# Patient Record
Sex: Female | Born: 1975 | Race: White | Hispanic: No | Marital: Married | Smoking: Former smoker
Health system: Southern US, Community
[De-identification: ages and names within clinical notes are randomized; demographics above are authoritative.]

## PROBLEM LIST (undated history)

## (undated) ENCOUNTER — Inpatient Hospital Stay (HOSPITAL_COMMUNITY): Payer: Self-pay

## (undated) DIAGNOSIS — D569 Thalassemia, unspecified: Secondary | ICD-10-CM

## (undated) DIAGNOSIS — I10 Essential (primary) hypertension: Secondary | ICD-10-CM

## (undated) DIAGNOSIS — I429 Cardiomyopathy, unspecified: Secondary | ICD-10-CM

## (undated) HISTORY — DX: Cardiomyopathy, unspecified: I42.9

---

## 2003-12-25 ENCOUNTER — Other Ambulatory Visit: Admission: RE | Admit: 2003-12-25 | Discharge: 2003-12-25 | Payer: Self-pay | Admitting: Family Medicine

## 2005-01-27 ENCOUNTER — Other Ambulatory Visit: Admission: RE | Admit: 2005-01-27 | Discharge: 2005-01-27 | Payer: Self-pay | Admitting: Family Medicine

## 2005-06-01 ENCOUNTER — Other Ambulatory Visit: Admission: RE | Admit: 2005-06-01 | Discharge: 2005-06-01 | Payer: Self-pay | Admitting: Family Medicine

## 2006-02-19 ENCOUNTER — Other Ambulatory Visit: Admission: RE | Admit: 2006-02-19 | Discharge: 2006-02-19 | Payer: Self-pay | Admitting: Family Medicine

## 2007-03-29 ENCOUNTER — Other Ambulatory Visit: Admission: RE | Admit: 2007-03-29 | Discharge: 2007-03-29 | Payer: Self-pay | Admitting: Family Medicine

## 2007-04-04 ENCOUNTER — Ambulatory Visit: Payer: Self-pay | Admitting: Internal Medicine

## 2007-04-11 LAB — CBC & DIFF AND RETIC
BASO%: 0.9 % (ref 0.0–2.0)
EOS%: 0.5 % (ref 0.0–7.0)
HCT: 33.4 % — ABNORMAL LOW (ref 34.8–46.6)
LYMPH%: 18.3 % (ref 14.0–48.0)
MCH: 21.2 pg — ABNORMAL LOW (ref 26.0–34.0)
MCHC: 33.1 g/dL (ref 32.0–36.0)
MCV: 64 fL — ABNORMAL LOW (ref 81.0–101.0)
MONO%: 9.2 % (ref 0.0–13.0)
NEUT%: 71.1 % (ref 39.6–76.8)
Platelets: 350 10*3/uL (ref 145–400)

## 2007-04-13 LAB — IRON AND TIBC
%SAT: 4 % — ABNORMAL LOW (ref 20–55)
Iron: 17 ug/dL — ABNORMAL LOW (ref 42–145)

## 2007-04-13 LAB — HEMOGLOBINOPATHY EVALUATION
Hemoglobin Other: 0 % (ref 0.0–0.0)
Hgb A: 93.8 % — ABNORMAL LOW (ref 96.8–97.8)

## 2007-04-13 LAB — COMPREHENSIVE METABOLIC PANEL
BUN: 6 mg/dL (ref 6–23)
CO2: 22 mEq/L (ref 19–32)
Glucose, Bld: 105 mg/dL — ABNORMAL HIGH (ref 70–99)
Sodium: 139 mEq/L (ref 135–145)
Total Bilirubin: 0.6 mg/dL (ref 0.3–1.2)
Total Protein: 7.2 g/dL (ref 6.0–8.3)

## 2007-04-13 LAB — FOLATE RBC: RBC Folate: 654 ng/mL — ABNORMAL HIGH (ref 180–600)

## 2007-04-13 LAB — PROTEIN ELECTROPHORESIS, SERUM
Alpha-2-Globulin: 11.3 % (ref 7.1–11.8)
Gamma Globulin: 13.8 % (ref 11.1–18.8)

## 2007-04-13 LAB — VITAMIN B12: Vitamin B-12: 232 pg/mL (ref 211–911)

## 2007-04-13 LAB — LACTATE DEHYDROGENASE: LDH: 135 U/L (ref 94–250)

## 2007-04-25 LAB — CBC WITH DIFFERENTIAL/PLATELET
Basophils Absolute: 0.1 10*3/uL (ref 0.0–0.1)
EOS%: 0.9 % (ref 0.0–7.0)
Eosinophils Absolute: 0.1 10*3/uL (ref 0.0–0.5)
HCT: 32.8 % — ABNORMAL LOW (ref 34.8–46.6)
HGB: 10.8 g/dL — ABNORMAL LOW (ref 11.6–15.9)
MCH: 20.8 pg — ABNORMAL LOW (ref 26.0–34.0)
NEUT#: 8.1 10*3/uL — ABNORMAL HIGH (ref 1.5–6.5)
NEUT%: 66 % (ref 39.6–76.8)
lymph#: 3 10*3/uL (ref 0.9–3.3)

## 2007-04-25 LAB — LACTATE DEHYDROGENASE: LDH: 125 U/L (ref 94–250)

## 2007-06-16 ENCOUNTER — Ambulatory Visit: Payer: Self-pay | Admitting: Internal Medicine

## 2007-06-20 LAB — CBC WITH DIFFERENTIAL/PLATELET
BASO%: 0.4 % (ref 0.0–2.0)
EOS%: 3.2 % (ref 0.0–7.0)
HCT: 32.8 % — ABNORMAL LOW (ref 34.8–46.6)
MCH: 20.9 pg — ABNORMAL LOW (ref 26.0–34.0)
MCHC: 33 g/dL (ref 32.0–36.0)
MONO#: 0.7 10*3/uL (ref 0.1–0.9)
NEUT%: 62.5 % (ref 39.6–76.8)
RBC: 5.17 10*6/uL (ref 3.70–5.32)
WBC: 9.1 10*3/uL (ref 3.9–10.0)
lymph#: 2.4 10*3/uL (ref 0.9–3.3)

## 2007-06-20 LAB — IRON AND TIBC
Iron: 74 ug/dL (ref 42–145)
TIBC: 473 ug/dL — ABNORMAL HIGH (ref 250–470)

## 2007-09-15 ENCOUNTER — Ambulatory Visit: Payer: Self-pay | Admitting: Internal Medicine

## 2007-09-19 LAB — CBC WITH DIFFERENTIAL/PLATELET
EOS%: 3.8 % (ref 0.0–7.0)
MCH: 21.1 pg — ABNORMAL LOW (ref 26.0–34.0)
MCV: 63.8 fL — ABNORMAL LOW (ref 81.0–101.0)
MONO%: 9 % (ref 0.0–13.0)
RBC: 5.1 10*6/uL (ref 3.70–5.32)
RDW: 15 % — ABNORMAL HIGH (ref 11.3–14.5)

## 2007-09-19 LAB — IRON AND TIBC
Iron: 124 ug/dL (ref 42–145)
UIBC: 361 ug/dL

## 2007-09-19 LAB — FERRITIN: Ferritin: 75 ng/mL (ref 10–291)

## 2009-03-21 ENCOUNTER — Other Ambulatory Visit: Admission: RE | Admit: 2009-03-21 | Discharge: 2009-03-21 | Payer: Self-pay | Admitting: Family Medicine

## 2009-04-11 ENCOUNTER — Ambulatory Visit: Payer: Self-pay | Admitting: Internal Medicine

## 2009-04-15 LAB — CBC WITH DIFFERENTIAL/PLATELET
Eosinophils Absolute: 0.1 10*3/uL (ref 0.0–0.5)
HGB: 10.7 g/dL — ABNORMAL LOW (ref 11.6–15.9)
MCV: 65.8 fL — ABNORMAL LOW (ref 79.5–101.0)
MONO#: 1 10*3/uL — ABNORMAL HIGH (ref 0.1–0.9)
MONO%: 7 % (ref 0.0–14.0)
NEUT#: 10.5 10*3/uL — ABNORMAL HIGH (ref 1.5–6.5)
RBC: 4.98 10*6/uL (ref 3.70–5.45)
RDW: 15.1 % — ABNORMAL HIGH (ref 11.2–14.5)
WBC: 14.6 10*3/uL — ABNORMAL HIGH (ref 3.9–10.3)
lymph#: 3 10*3/uL (ref 0.9–3.3)

## 2009-04-15 LAB — IRON AND TIBC
Iron: 117 ug/dL (ref 42–145)
TIBC: 465 ug/dL (ref 250–470)
UIBC: 348 ug/dL

## 2009-04-18 LAB — LEUKOCYTE ALKALINE PHOS: Leukocyte Alkaline  Phos Stain: 73 (ref 33–149)

## 2009-04-18 LAB — LACTATE DEHYDROGENASE: LDH: 108 U/L (ref 94–250)

## 2009-06-13 ENCOUNTER — Ambulatory Visit: Payer: Self-pay | Admitting: Internal Medicine

## 2009-06-17 LAB — CBC WITH DIFFERENTIAL/PLATELET
BASO%: 1 % (ref 0.0–2.0)
Eosinophils Absolute: 0.1 10*3/uL (ref 0.0–0.5)
MCHC: 31.8 g/dL (ref 31.5–36.0)
MONO#: 0.8 10*3/uL (ref 0.1–0.9)
NEUT#: 6.5 10*3/uL (ref 1.5–6.5)
RBC: 5.11 10*6/uL (ref 3.70–5.45)
RDW: 14.8 % — ABNORMAL HIGH (ref 11.2–14.5)
WBC: 10.3 10*3/uL (ref 3.9–10.3)
lymph#: 2.8 10*3/uL (ref 0.9–3.3)
nRBC: 0 % (ref 0–0)

## 2009-06-17 LAB — LACTATE DEHYDROGENASE: LDH: 106 U/L (ref 94–250)

## 2010-04-25 ENCOUNTER — Other Ambulatory Visit: Admission: RE | Admit: 2010-04-25 | Discharge: 2010-04-25 | Payer: Self-pay | Admitting: Family Medicine

## 2014-02-06 DIAGNOSIS — I10 Essential (primary) hypertension: Secondary | ICD-10-CM | POA: Diagnosis present

## 2014-02-06 DIAGNOSIS — N979 Female infertility, unspecified: Secondary | ICD-10-CM | POA: Insufficient documentation

## 2015-02-19 LAB — OB RESULTS CONSOLE GC/CHLAMYDIA
Chlamydia: NEGATIVE
GC PROBE AMP, GENITAL: NEGATIVE

## 2015-02-19 LAB — OB RESULTS CONSOLE RUBELLA ANTIBODY, IGM: Rubella: IMMUNE

## 2015-02-19 LAB — OB RESULTS CONSOLE HEPATITIS B SURFACE ANTIGEN: HEP B S AG: NEGATIVE

## 2015-02-19 LAB — OB RESULTS CONSOLE RPR: RPR: NONREACTIVE

## 2015-02-19 LAB — OB RESULTS CONSOLE ABO/RH: RH Type: POSITIVE

## 2015-02-19 LAB — OB RESULTS CONSOLE HIV ANTIBODY (ROUTINE TESTING): HIV: NONREACTIVE

## 2015-02-19 LAB — OB RESULTS CONSOLE ANTIBODY SCREEN: ANTIBODY SCREEN: NEGATIVE

## 2015-03-18 ENCOUNTER — Other Ambulatory Visit: Payer: Self-pay | Admitting: Obstetrics and Gynecology

## 2015-03-19 LAB — CYTOLOGY - PAP

## 2015-08-02 ENCOUNTER — Encounter (HOSPITAL_COMMUNITY): Payer: Self-pay | Admitting: *Deleted

## 2015-08-02 ENCOUNTER — Inpatient Hospital Stay (HOSPITAL_COMMUNITY)
Admission: AD | Admit: 2015-08-02 | Discharge: 2015-08-20 | DRG: 765 | Disposition: A | Discharge status: Home / Self Care | Payer: BLUE CROSS/BLUE SHIELD | Source: Ambulatory Visit | Attending: Obstetrics | Admitting: Obstetrics

## 2015-08-02 ENCOUNTER — Ambulatory Visit (HOSPITAL_COMMUNITY): Payer: BLUE CROSS/BLUE SHIELD

## 2015-08-02 DIAGNOSIS — O4403 Placenta previa specified as without hemorrhage, third trimester: Secondary | ICD-10-CM

## 2015-08-02 DIAGNOSIS — Z3A31 31 weeks gestation of pregnancy: Secondary | ICD-10-CM | POA: Diagnosis not present

## 2015-08-02 DIAGNOSIS — Z87891 Personal history of nicotine dependence: Secondary | ICD-10-CM

## 2015-08-02 DIAGNOSIS — D563 Thalassemia minor: Secondary | ICD-10-CM | POA: Diagnosis present

## 2015-08-02 DIAGNOSIS — O99013 Anemia complicating pregnancy, third trimester: Secondary | ICD-10-CM

## 2015-08-02 DIAGNOSIS — D62 Acute posthemorrhagic anemia: Secondary | ICD-10-CM | POA: Diagnosis not present

## 2015-08-02 DIAGNOSIS — O9902 Anemia complicating childbirth: Secondary | ICD-10-CM | POA: Diagnosis not present

## 2015-08-02 DIAGNOSIS — O42113 Preterm premature rupture of membranes, onset of labor more than 24 hours following rupture, third trimester: Secondary | ICD-10-CM

## 2015-08-02 DIAGNOSIS — O9912 Other diseases of the blood and blood-forming organs and certain disorders involving the immune mechanism complicating childbirth: Secondary | ICD-10-CM | POA: Diagnosis present

## 2015-08-02 DIAGNOSIS — O163 Unspecified maternal hypertension, third trimester: Secondary | ICD-10-CM

## 2015-08-02 DIAGNOSIS — O4290 Premature rupture of membranes, unspecified as to length of time between rupture and onset of labor, unspecified weeks of gestation: Secondary | ICD-10-CM

## 2015-08-02 DIAGNOSIS — D569 Thalassemia, unspecified: Secondary | ICD-10-CM

## 2015-08-02 DIAGNOSIS — O10019 Pre-existing essential hypertension complicating pregnancy, unspecified trimester: Secondary | ICD-10-CM

## 2015-08-02 DIAGNOSIS — O368390 Maternal care for abnormalities of the fetal heart rate or rhythm, unspecified trimester, not applicable or unspecified: Secondary | ICD-10-CM

## 2015-08-02 DIAGNOSIS — O42913 Preterm premature rupture of membranes, unspecified as to length of time between rupture and onset of labor, third trimester: Principal | ICD-10-CM | POA: Diagnosis present

## 2015-08-02 DIAGNOSIS — O09813 Supervision of pregnancy resulting from assisted reproductive technology, third trimester: Secondary | ICD-10-CM

## 2015-08-02 DIAGNOSIS — O1092 Unspecified pre-existing hypertension complicating childbirth: Secondary | ICD-10-CM | POA: Diagnosis present

## 2015-08-02 DIAGNOSIS — Z1389 Encounter for screening for other disorder: Secondary | ICD-10-CM

## 2015-08-02 DIAGNOSIS — O42919 Preterm premature rupture of membranes, unspecified as to length of time between rupture and onset of labor, unspecified trimester: Secondary | ICD-10-CM | POA: Diagnosis present

## 2015-08-02 HISTORY — DX: Essential (primary) hypertension: I10

## 2015-08-02 HISTORY — DX: Thalassemia, unspecified: D56.9

## 2015-08-02 LAB — CBC
HCT: 28 % — ABNORMAL LOW (ref 36.0–46.0)
Hemoglobin: 8.6 g/dL — ABNORMAL LOW (ref 12.0–15.0)
MCH: 19.7 pg — AB (ref 26.0–34.0)
MCHC: 30.7 g/dL (ref 30.0–36.0)
MCV: 64.1 fL — ABNORMAL LOW (ref 78.0–100.0)
PLATELETS: 374 10*3/uL (ref 150–400)
RBC: 4.37 MIL/uL (ref 3.87–5.11)
RDW: 16.2 % — ABNORMAL HIGH (ref 11.5–15.5)
WBC: 13.6 10*3/uL — ABNORMAL HIGH (ref 4.0–10.5)

## 2015-08-02 LAB — POCT FERN TEST: POCT Fern Test: POSITIVE

## 2015-08-02 LAB — ABO/RH: ABO/RH(D): O POS

## 2015-08-02 LAB — OB RESULTS CONSOLE GBS: GBS: NEGATIVE

## 2015-08-02 LAB — TYPE AND SCREEN
ABO/RH(D): O POS
Antibody Screen: NEGATIVE

## 2015-08-02 MED ORDER — AZITHROMYCIN 250 MG PO TABS
500.0000 mg | ORAL_TABLET | Freq: Every day | ORAL | Status: AC
  Administered 2015-08-02 – 2015-08-08 (×7): 500 mg via ORAL
  Filled 2015-08-02 (×5): qty 2

## 2015-08-02 MED ORDER — ACETAMINOPHEN 325 MG PO TABS: 650.0000 mg | ORAL_TABLET | ORAL | Status: DC | PRN

## 2015-08-02 MED ORDER — MAGNESIUM SULFATE BOLUS VIA INFUSION
4.0000 g | Freq: Once | INTRAVENOUS | Status: AC
  Administered 2015-08-02: 4 g via INTRAVENOUS
  Filled 2015-08-02: qty 500

## 2015-08-02 MED ORDER — AMOXICILLIN 500 MG PO CAPS
500.0000 mg | ORAL_CAPSULE | Freq: Three times a day (TID) | ORAL | Status: AC
  Administered 2015-08-04 – 2015-08-08 (×15): 500 mg via ORAL
  Filled 2015-08-02 (×16): qty 1

## 2015-08-02 MED ORDER — BETAMETHASONE SOD PHOS & ACET 6 (3-3) MG/ML IJ SUSP
12.0000 mg | INTRAMUSCULAR | Status: AC
  Administered 2015-08-02 – 2015-08-03 (×2): 12 mg via INTRAMUSCULAR
  Filled 2015-08-02 (×2): qty 2

## 2015-08-02 MED ORDER — METHYLDOPA 250 MG PO TABS
250.0000 mg | ORAL_TABLET | Freq: Two times a day (BID) | ORAL | Status: DC
  Administered 2015-08-02 – 2015-08-17 (×31): 250 mg via ORAL
  Filled 2015-08-02 (×35): qty 1

## 2015-08-02 MED ORDER — MAGNESIUM SULFATE 50 % IJ SOLN
2.0000 g/h | INTRAMUSCULAR | Status: DC
  Filled 2015-08-02: qty 80

## 2015-08-02 MED ORDER — SODIUM CHLORIDE 0.9 % IV SOLN
2.0000 g | Freq: Four times a day (QID) | INTRAVENOUS | Status: AC
  Administered 2015-08-02 – 2015-08-03 (×8): 2 g via INTRAVENOUS
  Filled 2015-08-02 (×8): qty 2000

## 2015-08-02 MED ORDER — DOCUSATE SODIUM 100 MG PO CAPS
100.0000 mg | ORAL_CAPSULE | Freq: Every day | ORAL | Status: DC
  Administered 2015-08-02 – 2015-08-17 (×16): 100 mg via ORAL
  Filled 2015-08-02 (×20): qty 1

## 2015-08-02 MED ORDER — CALCIUM CARBONATE ANTACID 500 MG PO CHEW
2.0000 | CHEWABLE_TABLET | ORAL | Status: DC | PRN
  Filled 2015-08-02: qty 2

## 2015-08-02 MED ORDER — PRENATAL MULTIVITAMIN CH
1.0000 | ORAL_TABLET | Freq: Every day | ORAL | Status: DC
  Administered 2015-08-02 – 2015-08-07 (×6): 1 via ORAL
  Filled 2015-08-02 (×10): qty 1

## 2015-08-02 MED ORDER — LACTATED RINGERS IV SOLN
INTRAVENOUS | Status: DC
  Administered 2015-08-02 (×2): via INTRAVENOUS

## 2015-08-02 MED ORDER — ZOLPIDEM TARTRATE 5 MG PO TABS: 5.0000 mg | ORAL_TABLET | Freq: Every evening | ORAL | Status: DC | PRN

## 2015-08-02 NOTE — Progress Notes (Signed)
[place] today EFW 3lb 7oz (1566gm), 40%, vertex AFI 9.5 Placenta previa has RESOLVED   MFM recs: weekly AFI 2/2 h/o CHTN Delivery at 34 wks

## 2015-08-02 NOTE — MAU Note (Signed)
PATIENT FELT GUSH OF FLUID ABOUT 11:30 PM. NOT FEELING ANY CONTRACTIONS

## 2015-08-02 NOTE — MAU Provider Note (Signed)
  History     CSN: [id number]  Arrival date and time: 08/02/15 0003   First Provider Initiated Contact with Patient 08/02/15 [phone]      Chief Complaint  Patient presents with  . Rupture of Membranes   HPI Comments: Melinda Allen is a 40 y.o. G1P0 at [redacted]w[redacted]d who presents today with ROM. She states that she had a gush of clear fluid around 2300, and she has continued to leak. She denies any VB. She confirms fetal movement. She states that she has had some contractions, but they have not been consistent or too painful. She has CHTN on aldomet. She denies any other complications with the pregnancy.   History reviewed. No pertinent past medical history.  History reviewed. No pertinent past surgical history.  History reviewed. No pertinent family history.  Social History  Substance Use Topics  . Smoking status: Former Games developer  . Smokeless tobacco: None  . Alcohol Use: No    Allergies: Not on File  Prescriptions prior to admission  Medication Sig Dispense Refill Last Dose  . aspirin 81 MG tablet Take 81 mg by mouth daily.   08/02/2015 at Unknown time  . ferrous sulfate 325 (65 FE) MG tablet Take 325 mg by mouth 3 (three) times daily with meals.   08/02/2015 at Unknown time  . methyldopa (ALDOMET) 250 MG tablet Take 250 mg by mouth 2 (two) times daily.   08/02/2015 at Unknown time  . Prenatal Vit-Fe Fumarate-FA (MULTIVITAMIN-PRENATAL) 27-0.8 MG TABS tablet Take 1 tablet by mouth daily at 12 noon.   08/02/2015 at Unknown time    Review of Systems  Constitutional: Negative for fever and chills.  Gastrointestinal: Negative for nausea, vomiting, abdominal pain, diarrhea and constipation.  Genitourinary: Negative for dysuria, urgency and frequency.   Physical Exam   Blood pressure 132/85, pulse 118, temperature 98.3 F (36.8 C), temperature source Oral, resp. rate 20, height  (1.499 m), weight 79.833 kg (176 lb).  Physical Exam  Nursing note and vitals reviewed. Constitutional: She  is oriented to person, place, and time. She appears well-developed and well-nourished. No distress.  HENT:  Head: Normocephalic.  Cardiovascular: Normal rate.   Respiratory: Effort normal.  GI: Soft. There is no tenderness.  Genitourinary:   External: no lesion Vagina: pooling of a large amount of clear fluid Cervix: difficult to visualize because of fluid Uterus: AGA   Neurological: She is alert and oriented to person, place, and time.  Skin: Skin is warm and dry.  Psychiatric: She has a normal mood and affect.   FHT: 125, moderate with 10x10 accels, no decels Toco: irregular UCs  MAU Course  Procedures  MDM 0100: D/W Dr. Chestine Spore orders for admission  Assessment and Plan  PPROM  Reassuring M-F status Admit to antenatal BMZ x 2  Magnesium  Latency antibiotics   Tawnya Crook 08/02/2015, 1:07 AM

## 2015-08-02 NOTE — Progress Notes (Signed)
Pt eating unable to trace fhr

## 2015-08-02 NOTE — Consult Note (Signed)
[facility] --  [facility] 08/02/2015    9:26 PM  Neonatal Medicine Consultation         Melinda Allen          MRN:  [id number]  I was called at the request of the patient's obstetrician (Dr. Chestine Spore) to speak to this patient due to probable preterm delivery as early as 31 weeks.  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  She is 31 1/7 weeks currently.  She is admitted to room 161, and is receiving treatment that includes legacy antibiotics, betamethasone, and magnesium sulfate.  A test for GBS is pending.  The baby is a female.  I reviewed expectations for a baby born at 31 weeks, including survival, length of stay, morbidities such as respiratory distress, IVH, infection, feeding intolerance, retinopathy.  I described how we provide respiratory and feeding support.  Mom plans to breast feed, which I encouraged as best for the baby (with supplementations for needed calories).    I spent 30 minutes reviewing the record, speaking to the patient, and entering appropriate documentation.  More than 50% of the time was spent face to face with patient.   _____________________ Electronically Signed By: Angelita Ingles, MD Neonatologist

## 2015-08-02 NOTE — H&P (Signed)
40 y.o. G1P0 @ [redacted]w[redacted]d presents with c/o LOF.  On exam in MAU, she was noted to be grossly ruptured.  Otherwise has good fetal movement and no bleeding.  Pregnancy c/b:  1. CHTN: on aldomet BID 2. 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  Currently on TID iron 3. Placenta previa: diagnosed at anatomy US.  No bleeding episodes during this pregnancy to date  Past Medical History  Diagnosis Date  . Hypertension   . Thalassemia, unspecified     Past Surgical History  Procedure Laterality Date  . No past surgeries      OB History  Gravida Para Term Preterm AB SAB TAB Ectopic Multiple Living  1             # Outcome Date GA Lbr Len/2nd Weight Sex Delivery Anes PTL Lv  1 Current               Social History   Social History  . Marital Status: Married    Spouse Name: N/A  . Number of Children: N/A  . Years of Education: N/A   Occupational History  . Not on file.   Social History Main Topics  . Smoking status: Former Smoker    Quit date: 03/01/2014  . Smokeless tobacco: Not on file  . Alcohol Use: No  . Drug Use: No  . Sexual Activity: Yes   Other Topics Concern  . Not on file   Social History Narrative  . No narrative on file   Review of patient's allergies indicates not on file.    Prenatal Transfer Tool  Maternal Diabetes: No Genetic Screening: Normal Maternal Ultrasounds/Referrals: Normal Fetal Ultrasounds or other Referrals:  None Maternal Substance Abuse:  No Significant Maternal Medications:  Meds include: Other:   Iron, aldomet Significant Maternal Lab Results: Lab values include: Other:   hgb 8.7   Filed Vitals:   08/02/15 0601 08/02/15 0701  BP: 116/75 119/80  Pulse: 102 107  Temp: 98.3 F (36.8 C) 98.2 F (36.8 C)  Resp: 16 16     General:  NAD Abdomen:  soft, gravid Ex:  no edema SSE:  Grossly ruptured, SVE deferred FHTs:  120s, mod var, + accels, no decels Toco:  Irregular  ctx   A/P   40 y.o. G1P0 [redacted]w[redacted]d presents with PPROM day #1 Admit to AP Latency antibiotics, magnesium for NP, BMZ series GBS unknown, will collect MFM [place] today for f/u placenta previa and EFW No s/sx labor, abruption, PTL at this time Continuous fetal monitoring.  SCDs Anemia--check cbc now--may need active T&C if previa is still present   [facility]EL [employer]

## 2015-08-02 NOTE — Progress Notes (Signed)
Labs reviewed in Epic from 2008 Hemoglobin electrophoresis showed 93.8% Hgb A and 5.2% Hgb A 2.  No Hgb F.  C/w beta thalassemia minor

## 2015-08-02 NOTE — Progress Notes (Signed)
Returned from u/s

## 2015-08-02 NOTE — Progress Notes (Signed)
NICU (Dr Katrinka Blazing) notified of NICU consult

## 2015-08-03 MED ORDER — FERROUS SULFATE 325 (65 FE) MG PO TABS
325.0000 mg | ORAL_TABLET | Freq: Three times a day (TID) | ORAL | Status: DC
  Administered 2015-08-03 – 2015-08-17 (×42): 325 mg via ORAL
  Filled 2015-08-03 (×46): qty 1

## 2015-08-03 MED ORDER — CITRIC ACID-SODIUM CITRATE 334-500 MG/5ML PO SOLN
ORAL | Status: AC
  Filled 2015-08-03: qty 15

## 2015-08-03 MED ORDER — LACTATED RINGERS IV SOLN
INTRAVENOUS | Status: DC
  Administered 2015-08-03 – 2015-08-04 (×2): via INTRAVENOUS
  Administered 2015-08-05: 250 mL/h via INTRAVENOUS
  Administered 2015-08-05 – 2015-08-10 (×7): via INTRAVENOUS
  Administered 2015-08-10: 500 mL/h via INTRAVENOUS
  Administered 2015-08-12 – 2015-08-17 (×7): via INTRAVENOUS

## 2015-08-03 NOTE — Progress Notes (Signed)
CTBS for prolonged deceleration with a nadir to the 70s for 10 minutes.   Upon my arrival, presentation had been confirmed to be vertex by BSUS SVE FT/long/high, no cord prolapse noted.   Baby had recovered with return to baseline in the 130s, moderate variability, + 10 x 10 accels  Patient also had a prolonged deceleration x 8 minutes this AM at 7 AM.  Since that time, she had no additional decelerations until now.    I scanned the fetus again at bedside to confirm vertex presentation.  There is no evidence of a funic presentation or cord near the vertex to explain the prolonged decels.   I discussed with the patient and her husband that fetal monitoring has been overall reassuring, with the exception of the rare prolonged decelerations.  I discussed that my goal would be continued expectant management at this time.  She will be steroid complete at 0200 tonight.  If the decelerations become more frequent, can consider moving toward delivery.  Plan:  Continuous fetal monitoring.   T&S current Will place second IV  NICU, anesthesia made aware of patient's current status by nursing.

## 2015-08-03 NOTE — Progress Notes (Signed)
   Melinda Allen is a 40 y.o. G1P0 at [redacted]w[redacted]d by early ultrasound who is admitted for PROM.   Fetal presentation is cephalic. Reported fetal bradycardia Length of Stay:  1  Days  Subjective:  Patient reports the fetal movement as active. Patient reports uterine contraction  activity as none. Patient reports  vaginal bleeding as none. Patient describes fluid per vagina as Clear.  Vitals:  Blood pressure 111/67, pulse 94, temperature 98.4 F (36.9 C), temperature source Oral, resp. rate 20, height  (1.499 m), weight 79.833 kg (176 lb), SpO2 93 %. Physical Examination:  General appearance - alert, well appearing, and in no distress Heart - normal rate and regular rhythm Abdomen - soft, nontender, nondistended Fundal Height:  size equals dates Cervical Exam:   Cervical Exam      Dilation  0.5 filed at 08/03/2015 1832    Effacement (%)  Thick filed at 08/03/2015 1832    Vag. Bleeding  None filed at 08/02/2015 0981    Station  -3 filed at 08/03/2015 1832    Presentation  Vertex filed at 08/03/2015 1834    Exam by:  Hazel Sams. RROB, RNC filed at 08/03/2015 1832     Membranes:ruptured  Fetal Monitoring:     Fetal Heart Rate A      Mode  External filed at 08/03/2015 1800    Baseline Rate (A)  115 bpm filed at 08/03/2015 1834    Variability  6-25 BPM filed at 08/03/2015 1800    Accelerations  15 x 15 filed at 08/03/2015 1800    Decelerations  None filed at 08/03/2015 1800    S/p bradycardia to 80-90 now 130's  Labs:  No results found for this or any previous visit (from the past 24 hour(s)).  Imaging Studies:     Currently EPIC will not allow sonographic studies to automatically populate into notes.  In the meantime, copy and paste results into note or free text.  Medications:  Scheduled . ampicillin (OMNIPEN) IV  2 g Intravenous Q6H   Followed by  . [START ON 08/04/2015] amoxicillin  500 mg Oral Q8H  . azithromycin  500 mg Oral Daily  . citric  acid-sodium citrate      . docusate sodium  100 mg Oral Daily  . ferrous sulfate  325 mg Oral TID WC  . methyldopa  250 mg Oral BID  . prenatal multivitamin  1 tablet Oral Q1200   I have reviewed the patient's current medications.  ASSESSMENT: Patient Active Problem List   Diagnosis Date Noted  . Preterm premature rupture of membranes (PPROM) with unknown onset of labor 08/02/2015    PLAN: Continuous monitoring, Dr. Chestine Spore notified  Dejon Lukas 08/03/2015,6:42 PM

## 2015-08-03 NOTE — Progress Notes (Signed)
40 y.o. G1P0 [redacted]w[redacted]d HD#2 with PPROM  Pt currently stable with no complaints of contractions, cramping, vaginal bleeding, or abdominal pain.  Good FM. Had two prolonged decelerations (5-8 min) overnight, one after ambulating to bathroom, one after position change  [place] yesterday--previa resolved  BP 109/65 mmHg  Pulse 96  Temp(Src) 97.7 F (36.5 C) (Oral)  Resp 18  Ht  (1.499 m)  Wt 79.833 kg (176 lb)  BMI 35.53 kg/m2  SpO2 93%   Abd  Soft, gravid, nontender Ex SCDs FHTs  120s, mod var, + accels, two prolonged decels as noted above, but in between reactive and reassuring tracing.  Toco  quiet   A:  HD# 2  G1@  [redacted]w[redacted]d with PPROM day 2.  P: Continue latency antibiotics BMZ #2 yesterday evening Continuous fetal monitoring given prolonged decel at 7AM.  No s/sx labor, abruption, intra-amniotic infection at this time vtx on [place] yesterday   [facility]

## 2015-08-04 LAB — CULTURE, BETA STREP (GROUP B ONLY)

## 2015-08-05 ENCOUNTER — Encounter (HOSPITAL_COMMUNITY): Payer: Self-pay | Admitting: *Deleted

## 2015-08-05 LAB — TYPE AND SCREEN
ABO/RH(D): O POS
ANTIBODY SCREEN: NEGATIVE

## 2015-08-05 NOTE — Progress Notes (Signed)
39 y.o. G1P0 [redacted]w[redacted]d HD#3 with PPROM  Pt currently stable with no complaints of contractions, cramping, vaginal bleeding, or abdominal pain.  Good FM. Had two prolonged decelerations yesterday, 8-10 minutes each, approximately 12 hr apart.  Apparently unprovoked.  No other decelerations noted in between the prolonged decels  AFVSS Abd  Soft, gravid, nontender Ex SCDs FHTs  120s, mod var, + accels, no decels other than the two prolonged decels noted above Toco  quiet   A:  HD# 3  G1@  [redacted]w[redacted]d with PPROM day 3.  P: Continue latency antibiotics BMZ complete No s/sx labor, abruption, intra-amniotic infection at this time.  Given reassuring monitoring in between prolonged decelerations, do not feel that delivery is warranted at this time.  If decels become more frequent, will reconsider delivery plan.  Continuous fetal monitoring given prolonged decels vtx on [place]   [facility] [employer]

## 2015-08-05 NOTE — Progress Notes (Signed)
Patient ID: Melinda Allen, female   DOB: 08-10-1975, 40 y.o.   MRN: [id number]  S: Pt back from being up to bathroom. Pt & husband heard HR was low after returning from bathroom. FHR was 70s and remained so for ~ 4-5 minutes. Pt rotated to both left and right sides. Cvx exam showed the cx to be closed (checked with a sterile betadine glove). An attempt was made to elevate the fetal head. Head of the bed was put down, fluid bolus was initiated and supplemental O2 applied. FHR returned to baseline after ~ 10 mintes. Bedside US was performed, limited due to low fluid. A nuchal cord appears to be present. No obvious cord presentation was noted. O: AFVSS FHR 130 reactive now AP 1) Continue continuous monitoring

## 2015-08-05 NOTE — Progress Notes (Signed)
obix down, fhr tracing on paper strip.

## 2015-08-05 NOTE — Progress Notes (Signed)
Patient ID: Melinda Allen, female   DOB: 02/25/1976, 41 y.o.   MRN: [id number]  S: Pt had another spontaneous deceleration to the 70s that lasted approximately 8-10 minutes. This episode was unprovoked. Pt was falling asleep when episode occurred. Denies feeling significant uterine activity. Pt rotated to right & left and then placed in knee/chest and FHR spontaneously resolved to baseline 130s. FHR was reactive before and after.  O:  Filed Vitals:   08/05/15 1709 08/05/15 1758 08/05/15 1901 08/05/15 1954  BP:    115/73  Pulse:    105  Temp:    98.1 F (36.7 C)  TempSrc:    Oral  Resp: Height:      Weight:      SpO2:       AOX3 Abdomen soft Cvx deferred FHR no 130 Reactive, category 1 tracing  A/P 1) PPROM, s/p BMZ x 2 2) S/P Mag for neuroprophylaxis 3) Continues to get abx for latency 4) Bedside US today showed probably nuchal cord, no funic presentation 5) MOD Discussed with patient that may need cesarean delivery. Pt prepared for this 6) Will obtain MFM [place] tomorrow to formally evaluate nuchal cord 7) Pt was taken off strict bed rest today. Only one decel episode provoked by val salva. Will continue to allow patient bathroom privledges

## 2015-08-06 ENCOUNTER — Inpatient Hospital Stay (HOSPITAL_COMMUNITY): Payer: BLUE CROSS/BLUE SHIELD

## 2015-08-06 NOTE — Progress Notes (Signed)
Patient contracting q3-68minutes, but denies feeling them at this time. MD notified and no new plan at this time.

## 2015-08-07 NOTE — Progress Notes (Signed)
Transferred from Labor and Delivery.  She is settled into room.

## 2015-08-07 NOTE — Progress Notes (Signed)
HD#6 PPROM Pt comfortable, denies contractions, bleeding.  Reports good FM.  Denies fever or abd tenderness of any kind. FHT 130 reactive, no recent decels, prior physician alerted me to episodes of prolonged decelerations that have had good recovery with O2 and position change TOCO: irritable SVE: deferred A/P PPROM 31.6 S/p beta x 2 Day 6 abx for latency US performed 2/7 showing no funic presentation - baby cephalic Continuous monitoring and other routine antenatal care.

## 2015-08-08 LAB — TYPE AND SCREEN
ABO/RH(D): O POS
Antibody Screen: NEGATIVE

## 2015-08-08 MED ORDER — CITRIC ACID-SODIUM CITRATE 334-500 MG/5ML PO SOLN
ORAL | Status: AC
  Filled 2015-08-08: qty 15

## 2015-08-08 MED ORDER — PRENATAL MULTIVITAMIN CH
1.0000 | ORAL_TABLET | Freq: Every day | ORAL | Status: DC
  Administered 2015-08-08 – 2015-08-16 (×10): 1 via ORAL
  Filled 2015-08-08 (×11): qty 1

## 2015-08-08 NOTE — Progress Notes (Signed)
40 yo G1 @ [redacted]w[redacted]d w PPROM day 7 Denies contractions, abdominal pain, vaginal bleeding. + good FM 1 short 3 min decel to 90s yesterday evening.  Toco was picking up ctx q4 min yesterday morning--pt reports rare cramping but denies feeling contractions  Toco: irritable  FHTs: 120s, mod var, + accels, no decels overnight SVE: deferred  A&P: PPROM day 6 Doing well.  Continue expectant managemet S/p BMZ x 2, on day 7 latency abx vtx at last [place] Continuous EFM given h/o prolonged decelerations + SCDs

## 2015-08-08 NOTE — Progress Notes (Signed)
Initial Nutrition Assessment  DOCUMENTATION CODES:  Obesity unspecified  INTERVENTION:  Regular diet Snacks TID and double protein portions if requested  NUTRITION DIAGNOSIS:  Increased nutrient needs related to  (pregnancy and fetal growth requirements) as evidenced by  (32 weeks IUP).  GOAL:  Patient will meet greater than or equal to 90% of their needs  MONITOR:  Weight trends  REASON FOR ASSESSMENT:  Antenatal   ASSESSMENT:   32 weeks, PROM. appetite good. 12 lb weight gain  Diet Order:  Diet regular Room service appropriate?: Yes; Fluid consistency:: Thin  Skin:  Reviewed, no issues  Height:   Ht Readings from Last 1 Encounters:  08/02/15  (1.499 m)   Weight:   Wt Readings from Last 1 Encounters:  08/02/15 176 lb (79.833 kg)    Ideal Body Weight:     BMI:  Body mass index is 35.53 kg/(m^2).  Estimated Nutritional Needs:   Kcal:  1800-2000  Protein:  80-90 g  Fluid:  2.1 L  EDUCATION NEEDS:   No education needs identified at this time  Inez Pilgrim.Odis Luster LDN Neonatal Nutrition Support Specialist/RD III Pager [phone]      Phone [phone]

## 2015-08-09 MED ORDER — CITRIC ACID-SODIUM CITRATE 334-500 MG/5ML PO SOLN
ORAL | Status: DC
Start: 2015-08-09 — End: 2015-08-09
  Filled 2015-08-09: qty 15

## 2015-08-09 NOTE — Progress Notes (Signed)
Prolonged decel noted lasting 8 minutes with nadir in the 70's.  Pt repositioned, O2 applied, IVF bolus initiated, and MD called.

## 2015-08-09 NOTE — Progress Notes (Signed)
Pt states that she is doing well. She is still leaking fluid per vagina, Good FM, No ctxs. VSSAF IMP/ PPROM, stable Plan/ continue present care plan

## 2015-08-10 NOTE — Progress Notes (Signed)
Dr Dareen Piano called, pt had prolonged decel that lasted approx. 10 minutes. Patient repositioned, O2 applied and IVF bolus given. Dr Dareen Piano acknowledged, returning to baseline FHR, will continue to monitor.

## 2015-08-11 LAB — TYPE AND SCREEN
ABO/RH(D): O POS
ANTIBODY SCREEN: NEGATIVE

## 2015-08-11 NOTE — Progress Notes (Signed)
Pt had another decel last pm. Seems to have one approximately q 36 hours. She is off abxs now. She has no bleeding, good FM, No change in vaginal discharge PLAN/Will continue to follow, Deliver at 34 weeks

## 2015-08-12 NOTE — Progress Notes (Signed)
Pt returned from bathroom & when EFM hooked up, fetal decel noted to be occuring, with return to baseline.

## 2015-08-12 NOTE — Progress Notes (Signed)
40 yo G1 @ [redacted]w[redacted]d w PPROM day 11 Denies contractions, abdominal pain, vaginal bleeding. + good FM Now w prolonged decel q24-36h Toco was picking up ctx q4 min yesterday morning--pt denies any contractions or cramping  NAD Abd: soft, no fundal tenderness Toco: quiet FHTs: 130s, mod var, + accels, no decels overnight SVE: deferred  A&P: PPROM day 11 Doing well.  Continue expectant managemet S/p BMZ x 2, s/p latency antibioitics vtx at last [place] Continuous EFM given h/o prolonged decelerations + SCDs

## 2015-08-12 NOTE — Progress Notes (Signed)
Pt returned from bathroom, FHR noted to be in the 100's for approx 1 minute before rising back to baseline.

## 2015-08-13 NOTE — Progress Notes (Signed)
40 y.o. G1P0 [redacted]w[redacted]d HD#11 admitted for 31 WEEKS WATERY DISCHARGE.  Pt currently stable with no c/o today.  Good FM.  Filed Vitals:   08/13/15 0529 08/13/15 0530 08/13/15 0534 08/13/15 0539  BP:      Pulse: 92 92 91 81  Temp:      TempSrc:      Resp:      Height:      Weight:      SpO2: 96% 96% 95% 98%    Lungs CTA Cor RRR Abd  Soft, gravid, nontender Ex SCDs FHTs  120s, good short term variability, NST R with huge accels; occ 3-4 minute decels to 90s with quick return. Toco  occ  No results found for this or any previous visit (from the past 24 hour(s)).  A:  HD#12 [redacted]w[redacted]d with PPROM.  P:Doing well. Continue expectant managemet S/p BMZ x 2, s/p latency antibioitics vtx at last [place] Continuous EFM given h/o prolonged decelerations + SCDs  Baby was 3#7 on 08-02-15  [name] A

## 2015-08-14 LAB — TYPE AND SCREEN
ABO/RH(D): O POS
Antibody Screen: NEGATIVE

## 2015-08-14 NOTE — Progress Notes (Signed)
40 y.o. G1P0 [redacted]w[redacted]d HD#12 admitted for PPROM  Pt currently stable with no c/o today.  Good FM.  Filed Vitals:   08/14/15 0335 08/14/15 0340 08/14/15 0651 08/14/15 0730  BP:   100/64 107/68  Pulse: 76 108 84 93  Temp:   97.9 F (36.6 C) 97.9 F (36.6 C)  TempSrc:   Oral Oral  Resp:   18 20  Height:      Weight:      SpO2: 98% 99%      Lungs CTA Cor RRR Abd  Soft, gravid, nontender Ex SCDs FHTs  120s, good short term variability, NST R with huge accels; occ 3-4 minute decels to 90s with quick return. Toco  occ  Results for orders placed or performed during the hospital encounter of 08/02/15 (from the past 24 hour(s))  Type and screen [facility] [place]     Status: None   Collection Time: 08/14/15  5:59 AM  Result Value Ref Range   ABO/RH(D) O POS    Antibody Screen NEG    Sample Expiration 08/17/2015     A:  HD#12 [redacted]w[redacted]d with PPROM.  P:Doing well. Continue expectant managemet S/p BMZ x 2, s/p latency antibioitics vtx at last [place] Continuous EFM given h/o prolonged decelerations. Another episode of prolonged decel last night and then this am. Seems to correlate with patient using bathroom. Reactive tracing before and after + SCDs Likely proceed with IOL @ 34 weeks. May administer a repeat dose of BMZ x 1 @ 33+6.  Will check with NICU on bed status before proceeding with IOL   Baby was 3#7 on 08-02-15  Jaeven Wanzer H.

## 2015-08-15 NOTE — Progress Notes (Signed)
40 y.o. G1P0 [redacted]w[redacted]d HD#13 admitted for PPROM  Pt currently stable with no c/o today.  Good FM.  Occ ctx on toco, but patient denies any contractions  Filed Vitals:   08/14/15 2155 08/14/15 2305 08/15/15 0800 08/15/15 1207  BP:   110/64 100/52  Pulse:   95 106  Temp:   98.8 F (37.1 C) 98.6 F (37 C)  TempSrc:   Oral Oral  Resp: Height:      Weight:      SpO2:        NAD Abd  Soft, gravid, nontender Ex SCDs FHTs  120s, moderate variability, + accels, no decels overnight. Toco  occ  A:  HD#13  G1 @ [redacted]w[redacted]d with PPROM.  P:Doing well. Continue expectant managemet S/p BMZ x 2, s/p latency antibioitics vtx at last [place] Continuous EFM given h/o prolonged decelerations. + SCDs  Baby was 3#7 on 08-02-15  [facility]EL [place]

## 2015-08-16 MED ORDER — CITRIC ACID-SODIUM CITRATE 334-500 MG/5ML PO SOLN
ORAL | Status: DC
Start: 2015-08-16 — End: 2015-08-16
  Filled 2015-08-16: qty 15

## 2015-08-16 NOTE — Progress Notes (Signed)
33.1 PPROM Comfortable, denies contractions, bleeding, reports good FM FHT: as of 7:51pm reviewed today's strip - 125 mod var + accels, occ variable decel lasting 1-2 minutes with good return to baseline.  Approx 11pm last night prolonged decel of 12 minutes. TOCO: quiet SVE: deferreed A/P: 33.1 PPROM S/p beta x 2, s/p abx x 7 days Plan for IOL at 34 weeks Continuous monitoring Had long discussion with RN staff of antenatal regarding request for prompt alerts with decels lasting more than a couple minutes.  Requested that if I am not inhouse, FHT decel should prompt faculty assistance no later than 5 minutes in case C/S needs to be called.  Then to call me immediately to hospital.

## 2015-08-17 ENCOUNTER — Encounter (HOSPITAL_COMMUNITY): Payer: Self-pay | Admitting: Anesthesiology

## 2015-08-17 ENCOUNTER — Inpatient Hospital Stay (HOSPITAL_COMMUNITY): Payer: BLUE CROSS/BLUE SHIELD | Admitting: Anesthesiology

## 2015-08-17 ENCOUNTER — Encounter (HOSPITAL_COMMUNITY)
Admission: AD | Disposition: A | Discharge status: Home / Self Care | Payer: Self-pay | Source: Ambulatory Visit | Attending: Obstetrics

## 2015-08-17 LAB — TYPE AND SCREEN
ABO/RH(D): O POS
ANTIBODY SCREEN: NEGATIVE

## 2015-08-17 SURGERY — Surgical Case
Anesthesia: Spinal

## 2015-08-17 MED ORDER — MORPHINE SULFATE (PF) 0.5 MG/ML IJ SOLN
INTRAMUSCULAR | Status: AC
  Filled 2015-08-17: qty 10

## 2015-08-17 MED ORDER — CEFAZOLIN SODIUM-DEXTROSE 2-3 GM-% IV SOLR
INTRAVENOUS | Status: AC
  Filled 2015-08-17: qty 50

## 2015-08-17 MED ORDER — WITCH HAZEL-GLYCERIN EX PADS: 1.0000 "application " | MEDICATED_PAD | CUTANEOUS | Status: DC | PRN

## 2015-08-17 MED ORDER — HYDROMORPHONE HCL 1 MG/ML IJ SOLN: 0.2500 mg | INTRAMUSCULAR | Status: DC | PRN

## 2015-08-17 MED ORDER — CITRIC ACID-SODIUM CITRATE 334-500 MG/5ML PO SOLN
30.0000 mL | Freq: Once | ORAL | Status: AC
  Administered 2015-08-17: 30 mL via ORAL
  Filled 2015-08-17: qty 30

## 2015-08-17 MED ORDER — ONDANSETRON HCL 4 MG/2ML IJ SOLN: 4.0000 mg | Freq: Three times a day (TID) | INTRAMUSCULAR | Status: DC | PRN

## 2015-08-17 MED ORDER — CEFAZOLIN SODIUM-DEXTROSE 2-3 GM-% IV SOLR
2.0000 g | Freq: Once | INTRAVENOUS | Status: AC
  Administered 2015-08-17: 2 g via INTRAVENOUS
  Filled 2015-08-17: qty 50

## 2015-08-17 MED ORDER — DIPHENHYDRAMINE HCL 25 MG PO CAPS: 25.0000 mg | ORAL_CAPSULE | ORAL | Status: DC | PRN

## 2015-08-17 MED ORDER — KETOROLAC TROMETHAMINE 30 MG/ML IJ SOLN: 30.0000 mg | Freq: Four times a day (QID) | INTRAMUSCULAR | Status: AC | PRN

## 2015-08-17 MED ORDER — ONDANSETRON HCL 4 MG/2ML IJ SOLN
INTRAMUSCULAR | Status: DC | PRN
  Administered 2015-08-17: 4 mg via INTRAVENOUS

## 2015-08-17 MED ORDER — DIBUCAINE 1 % RE OINT: 1.0000 "application " | TOPICAL_OINTMENT | RECTAL | Status: DC | PRN

## 2015-08-17 MED ORDER — MENTHOL 3 MG MT LOZG: 1.0000 | LOZENGE | OROMUCOSAL | Status: DC | PRN

## 2015-08-17 MED ORDER — NALOXONE HCL 0.4 MG/ML IJ SOLN: 0.4000 mg | INTRAMUSCULAR | Status: DC | PRN

## 2015-08-17 MED ORDER — SCOPOLAMINE 1 MG/3DAYS TD PT72
MEDICATED_PATCH | TRANSDERMAL | Status: DC | PRN
  Administered 2015-08-17: 1 via TRANSDERMAL

## 2015-08-17 MED ORDER — OXYCODONE HCL 5 MG PO TABS: 5.0000 mg | ORAL_TABLET | Freq: Once | ORAL | Status: DC | PRN

## 2015-08-17 MED ORDER — LACTATED RINGERS IV SOLN
INTRAVENOUS | Status: DC
  Administered 2015-08-17 – 2015-08-18 (×2): via INTRAVENOUS

## 2015-08-17 MED ORDER — SCOPOLAMINE 1 MG/3DAYS TD PT72: 1.0000 | MEDICATED_PATCH | Freq: Once | TRANSDERMAL | Status: DC

## 2015-08-17 MED ORDER — NALBUPHINE HCL 10 MG/ML IJ SOLN: 5.0000 mg | Freq: Once | INTRAMUSCULAR | Status: DC | PRN

## 2015-08-17 MED ORDER — DIPHENHYDRAMINE HCL 25 MG PO CAPS: 25.0000 mg | ORAL_CAPSULE | Freq: Four times a day (QID) | ORAL | Status: DC | PRN

## 2015-08-17 MED ORDER — DIPHENHYDRAMINE HCL 50 MG/ML IJ SOLN: 12.5000 mg | INTRAMUSCULAR | Status: DC | PRN

## 2015-08-17 MED ORDER — SODIUM CHLORIDE 0.9% FLUSH
3.0000 mL | INTRAVENOUS | Status: DC | PRN
  Administered 2015-08-18: 3 mL via INTRAVENOUS
  Filled 2015-08-17: qty 3

## 2015-08-17 MED ORDER — MEPERIDINE HCL 25 MG/ML IJ SOLN: 6.2500 mg | INTRAMUSCULAR | Status: DC | PRN

## 2015-08-17 MED ORDER — OXYCODONE-ACETAMINOPHEN 5-325 MG PO TABS
1.0000 | ORAL_TABLET | ORAL | Status: DC | PRN
  Administered 2015-08-18 (×2): 1 via ORAL
  Filled 2015-08-17 (×2): qty 1

## 2015-08-17 MED ORDER — SENNOSIDES-DOCUSATE SODIUM 8.6-50 MG PO TABS
2.0000 | ORAL_TABLET | ORAL | Status: DC
Start: 2015-08-18 — End: 2015-08-20
  Administered 2015-08-17 – 2015-08-19 (×3): 2 via ORAL
  Filled 2015-08-17 (×3): qty 2

## 2015-08-17 MED ORDER — ACETAMINOPHEN 325 MG PO TABS: 650.0000 mg | ORAL_TABLET | ORAL | Status: DC | PRN

## 2015-08-17 MED ORDER — PHENYLEPHRINE 8 MG IN D5W 100 ML (0.08MG/ML) PREMIX OPTIME
INJECTION | INTRAVENOUS | Status: AC
  Filled 2015-08-17: qty 100

## 2015-08-17 MED ORDER — TETANUS-DIPHTH-ACELL PERTUSSIS 5-2.5-18.5 LF-MCG/0.5 IM SUSP: 0.5000 mL | Freq: Once | INTRAMUSCULAR | Status: DC

## 2015-08-17 MED ORDER — PRENATAL MULTIVITAMIN CH
1.0000 | ORAL_TABLET | Freq: Every day | ORAL | Status: DC
  Administered 2015-08-18 – 2015-08-20 (×3): 1 via ORAL
  Filled 2015-08-17 (×3): qty 1

## 2015-08-17 MED ORDER — LACTATED RINGERS IV SOLN
40.0000 [IU] | INTRAVENOUS | Status: DC | PRN
  Administered 2015-08-17: 40 [IU] via INTRAVENOUS

## 2015-08-17 MED ORDER — OXYCODONE HCL 5 MG/5ML PO SOLN: 5.0000 mg | Freq: Once | ORAL | Status: DC | PRN

## 2015-08-17 MED ORDER — MORPHINE SULFATE (PF) 0.5 MG/ML IJ SOLN
INTRAMUSCULAR | Status: DC | PRN
  Administered 2015-08-17: .15 mg via EPIDURAL

## 2015-08-17 MED ORDER — LANOLIN HYDROUS EX OINT: 1.0000 "application " | TOPICAL_OINTMENT | CUTANEOUS | Status: DC | PRN

## 2015-08-17 MED ORDER — PROMETHAZINE HCL 25 MG/ML IJ SOLN: 6.2500 mg | INTRAMUSCULAR | Status: DC | PRN

## 2015-08-17 MED ORDER — PHENYLEPHRINE 8 MG IN D5W 100 ML (0.08MG/ML) PREMIX OPTIME
INJECTION | INTRAVENOUS | Status: DC | PRN
  Administered 2015-08-17: 60 ug/min via INTRAVENOUS

## 2015-08-17 MED ORDER — OXYTOCIN 10 UNIT/ML IJ SOLN: 2.5000 [IU]/h | INTRAVENOUS | Status: AC

## 2015-08-17 MED ORDER — NALOXONE HCL 2 MG/2ML IJ SOSY: 1.0000 ug/kg/h | PREFILLED_SYRINGE | INTRAVENOUS | Status: DC | PRN

## 2015-08-17 MED ORDER — SIMETHICONE 80 MG PO CHEW: 80.0000 mg | CHEWABLE_TABLET | ORAL | Status: DC | PRN

## 2015-08-17 MED ORDER — SIMETHICONE 80 MG PO CHEW
80.0000 mg | CHEWABLE_TABLET | Freq: Three times a day (TID) | ORAL | Status: DC
  Administered 2015-08-18 – 2015-08-20 (×8): 80 mg via ORAL
  Filled 2015-08-17 (×8): qty 1

## 2015-08-17 MED ORDER — NALBUPHINE HCL 10 MG/ML IJ SOLN: 5.0000 mg | INTRAMUSCULAR | Status: DC | PRN

## 2015-08-17 MED ORDER — ZOLPIDEM TARTRATE 5 MG PO TABS: 5.0000 mg | ORAL_TABLET | Freq: Every evening | ORAL | Status: DC | PRN

## 2015-08-17 MED ORDER — IBUPROFEN 600 MG PO TABS
600.0000 mg | ORAL_TABLET | Freq: Four times a day (QID) | ORAL | Status: DC
Start: 2015-08-18 — End: 2015-08-20
  Administered 2015-08-17 – 2015-08-20 (×11): 600 mg via ORAL
  Filled 2015-08-17 (×11): qty 1

## 2015-08-17 MED ORDER — KETOROLAC TROMETHAMINE 30 MG/ML IJ SOLN: 30.0000 mg | Freq: Once | INTRAMUSCULAR | Status: DC | PRN

## 2015-08-17 MED ORDER — SIMETHICONE 80 MG PO CHEW
80.0000 mg | CHEWABLE_TABLET | ORAL | Status: DC
Start: 2015-08-18 — End: 2015-08-20
  Administered 2015-08-17 – 2015-08-19 (×3): 80 mg via ORAL
  Filled 2015-08-17 (×3): qty 1

## 2015-08-17 MED ORDER — KETOROLAC TROMETHAMINE 30 MG/ML IJ SOLN
INTRAMUSCULAR | Status: AC
  Administered 2015-08-17: 30 mg via INTRAMUSCULAR
  Filled 2015-08-17: qty 1

## 2015-08-17 MED ORDER — OXYCODONE-ACETAMINOPHEN 5-325 MG PO TABS
2.0000 | ORAL_TABLET | ORAL | Status: DC | PRN
  Administered 2015-08-19: 2 via ORAL
  Filled 2015-08-17: qty 2

## 2015-08-17 MED ORDER — OXYTOCIN 10 UNIT/ML IJ SOLN
INTRAMUSCULAR | Status: AC
  Filled 2015-08-17: qty 4

## 2015-08-17 MED ORDER — ONDANSETRON HCL 4 MG/2ML IJ SOLN
INTRAMUSCULAR | Status: AC
  Filled 2015-08-17: qty 2

## 2015-08-17 SURGICAL SUPPLY — 30 items
CLAMP CORD UMBIL (MISCELLANEOUS) IMPLANT
CLOTH BEACON ORANGE TIMEOUT ST (SAFETY) ×2 IMPLANT
DRSG OPSITE POSTOP 4X10 (GAUZE/BANDAGES/DRESSINGS) ×2 IMPLANT
DURAPREP 26ML APPLICATOR (WOUND CARE) ×2 IMPLANT
ELECT REM PT RETURN 9FT ADLT (ELECTROSURGICAL) ×2
ELECTRODE REM PT RTRN 9FT ADLT (ELECTROSURGICAL) ×1 IMPLANT
EXTRACTOR VACUUM M CUP 4 TUBE (SUCTIONS) IMPLANT
GLOVE BIOGEL PI IND STRL 6.5 (GLOVE) ×1 IMPLANT
GLOVE BIOGEL PI IND STRL 7.0 (GLOVE) ×1 IMPLANT
GLOVE BIOGEL PI INDICATOR 6.5 (GLOVE) ×1
GLOVE BIOGEL PI INDICATOR 7.0 (GLOVE) ×1
GLOVE ECLIPSE 6.5 STRL STRAW (GLOVE) ×2 IMPLANT
GOWN STRL REUS W/TWL LRG LVL3 (GOWN DISPOSABLE) ×4 IMPLANT
KIT ABG SYR 3ML LUER SLIP (SYRINGE) IMPLANT
NEEDLE HYPO 25X5/8 SAFETYGLIDE (NEEDLE) IMPLANT
NS IRRIG 1000ML POUR BTL (IV SOLUTION) ×2 IMPLANT
PACK C SECTION WH (CUSTOM PROCEDURE TRAY) ×2 IMPLANT
PAD ABD 7.5X8 STRL (GAUZE/BANDAGES/DRESSINGS) IMPLANT
PAD OB MATERNITY 4.3X12.25 (PERSONAL CARE ITEMS) ×2 IMPLANT
PENCIL SMOKE EVAC W/HOLSTER (ELECTROSURGICAL) ×2 IMPLANT
RTRCTR C-SECT PINK 25CM LRG (MISCELLANEOUS) ×2 IMPLANT
SUT MON AB 2-0 CT1 27 (SUTURE) ×2 IMPLANT
SUT MON AB 4-0 PS1 27 (SUTURE) IMPLANT
SUT PDS AB 0 CTX 60 (SUTURE) IMPLANT
SUT PLAIN 2 0 XLH (SUTURE) IMPLANT
SUT VIC AB 0 CTX 36 (SUTURE) ×4
SUT VIC AB 0 CTX36XBRD ANBCTRL (SUTURE) ×4 IMPLANT
SUT VIC AB 4-0 KS 27 (SUTURE) IMPLANT
TOWEL OR 17X24 6PK STRL BLUE (TOWEL DISPOSABLE) ×2 IMPLANT
TRAY FOLEY CATH SILVER 14FR (SET/KITS/TRAYS/PACK) ×2 IMPLANT

## 2015-08-17 NOTE — Progress Notes (Addendum)
Dr. Jolayne Panther @ bedside, informed pt that considering FHR has now returned to baseline, Dr. Claiborne Billings will be in and decide POC.  Pt verbalized understanding & agreeable.

## 2015-08-17 NOTE — Brief Op Note (Signed)
08/02/2015 - 08/17/2015  3:23 PM  PATIENT:  Melinda Allen  40 y.o. female  PRE-OPERATIVE DIAGNOSIS:  non-reassuring fetal heart tones, PPROM, 33.2  POST-OPERATIVE DIAGNOSIS:  non-reassuring fetal heart tones, PPROM 33.2   PROCEDURE:  Procedure(s): CESAREAN SECTION (N/A) low transverse  SURGEON:  Surgeon(s) and Role:    Philip Aspen, DO - Primary  ANESTHESIA:   spinal  EBL:  Total I/O In: 2600 [I.V.:2600] Out: 1100 [Urine:400; Blood:700]  SPECIMEN:  Source of Specimen:  placenta and cord blood  DISPOSITION OF SPECIMEN:  PATHOLOGY  COUNTS:  YES  PLAN OF CARE: Admit to inpatient   PATIENT DISPOSITION:  PACU - hemodynamically stable.   Delay start of Pharmacological VTE agent (>24hrs) due to surgical blood loss or risk of bleeding: not applicable

## 2015-08-17 NOTE — Transfer of Care (Signed)
Immediate Anesthesia Transfer of Care Note  Patient: Melinda Allen  Procedure(s) Performed: Procedure(s): CESAREAN SECTION (N/A)  Patient Location: PACU  Anesthesia Type:Spinal  Level of Consciousness: awake, alert  and oriented  Airway & Oxygen Therapy: Patient Spontanous Breathing  Post-op Assessment: Report given to RN and Post -op Vital signs reviewed and stable  Post vital signs: Reviewed and stable  Last Vitals:  Filed Vitals:   08/17/15 0804 08/17/15 1204  BP: 112/78 106/70  Pulse: 99 107  Temp: 36.6 C 36.7 C  Resp: 20 18    Complications: No apparent anesthesia complications

## 2015-08-17 NOTE — Progress Notes (Signed)
Dr. Claiborne Billings @ bedside discussing POC with pt and her spouse.  Plan is to proceed with Cesarean Section after consulting MFM.

## 2015-08-17 NOTE — Progress Notes (Signed)
PPROM 33.2 No complaints. Denies VB, ctx.  Good FM FHT: 125 mod var + accels, rare short variable.  Prolonged decel about 11pm last night, returned to baseline, Cat 1 since TOCO: q6 SVe: deferred A/P: S/p beta and abx Plan for IOL 34 weeks Pt not feeling ctx noted on toco, will monitor Bedside commode to facilitate continuous monitoring

## 2015-08-17 NOTE — Progress Notes (Signed)
Pt to OR via bed.

## 2015-08-17 NOTE — Anesthesia Preprocedure Evaluation (Signed)
Anesthesia Evaluation  Patient identified by MRN, date of birth, ID band Patient awake    Reviewed: Allergy & Precautions, NPO status , Patient's Chart, lab work & pertinent test results  Airway Mallampati: III  TM Distance: >3 FB Neck ROM: Full    Dental no notable dental hx.    Pulmonary neg pulmonary ROS, former smoker,    Pulmonary exam normal breath sounds clear to auscultation       Cardiovascular hypertension, negative cardio ROS Normal cardiovascular exam Rhythm:Regular Rate:Normal     Neuro/Psych negative neurological ROS  negative psych ROS   GI/Hepatic negative GI ROS, Neg liver ROS,   Endo/Other  negative endocrine ROS  Renal/GU negative Renal ROS  negative genitourinary   Musculoskeletal negative musculoskeletal ROS (+)   Abdominal   Peds negative pediatric ROS (+)  Hematology  (+) anemia ,   Anesthesia Other Findings   Reproductive/Obstetrics (+) Pregnancy                             Anesthesia Physical Anesthesia Plan  ASA: III and emergent  Anesthesia Plan: Spinal   Post-op Pain Management:    Induction:   Airway Management Planned:   Additional Equipment:   Intra-op Plan:   Post-operative Plan:   Informed Consent: I have reviewed the patients History and Physical, chart, labs and discussed the procedure including the risks, benefits and alternatives for the proposed anesthesia with the patient or authorized representative who has indicated his/her understanding and acceptance.   Dental advisory given  Plan Discussed with: CRNA  Anesthesia Plan Comments:         Anesthesia Quick Evaluation

## 2015-08-17 NOTE — Progress Notes (Signed)
Late entry:  Alerted by antenatal staff of decel for 5 min.  Faculty physician Dr. Jolayne Panther requested at bedside.  By the time I arrived, fetal heart tones had returned to normal range and after a few minutes variability returned.  Given multiple prolonged decels I spoke with Dr. Germaine Pomfret  of MFM for additional guidance given pt was s/p betamethasone 2/3 and 2/4 and was now 33.2.  He agreed that given the circumstances a primary c/s under controlled circumstances at this time was preferable to continued monitoring.  This was discussed with patient and husband and they agreed to proceed to OR for primary c/s.

## 2015-08-17 NOTE — Progress Notes (Signed)
Dr. Jolayne Panther also notified regarding prolonged decel.  MD on her way to see pt.

## 2015-08-18 LAB — CBC
HEMATOCRIT: 20.9 % — AB (ref 36.0–46.0)
HEMOGLOBIN: 6.7 g/dL — AB (ref 12.0–15.0)
MCH: 20.4 pg — ABNORMAL LOW (ref 26.0–34.0)
MCHC: 32.1 g/dL (ref 30.0–36.0)
MCV: 63.7 fL — ABNORMAL LOW (ref 78.0–100.0)
Platelets: 261 10*3/uL (ref 150–400)
RBC: 3.28 MIL/uL — ABNORMAL LOW (ref 3.87–5.11)
RDW: 16.1 % — AB (ref 11.5–15.5)
WBC: 12.5 10*3/uL — AB (ref 4.0–10.5)

## 2015-08-18 MED ORDER — FERROUS SULFATE 325 (65 FE) MG PO TABS
325.0000 mg | ORAL_TABLET | Freq: Three times a day (TID) | ORAL | Status: DC
  Administered 2015-08-18 – 2015-08-20 (×8): 325 mg via ORAL
  Filled 2015-08-18 (×8): qty 1

## 2015-08-18 NOTE — Anesthesia Postprocedure Evaluation (Signed)
Anesthesia Post Note  Patient: Melinda Allen  Procedure(s) Performed: Procedure(s) (LRB): CESAREAN SECTION (N/A)  Patient location during evaluation: [facility] Anesthesia Type: Spinal Level of consciousness: oriented and awake and alert Pain management: pain level controlled Vital Signs Assessment: post-procedure vital signs reviewed and stable Respiratory status: spontaneous breathing and respiratory function stable Cardiovascular status: blood pressure returned to baseline and stable Postop Assessment: no headache, no backache, no signs of nausea or vomiting and adequate PO intake Anesthetic complications: no Comments: Pain score 3    Last Vitals:  Filed Vitals:   08/17/15 2104 08/18/15 0553  BP: 99/60 83/52  Pulse: 88 75  Temp: 36.7 C 37.1 C  Resp: 18 16    Last Pain:  Filed Vitals:   08/18/15 0639  PainSc: 2                  Eymi Lipuma

## 2015-08-18 NOTE — Lactation Note (Signed)
This note was copied from a baby's chart. Lactation Consultation Note; Initial visit with mom. Several visitors present. Mom reports she has been pumping q 3 hours but is not obtaining anything. Reassurance and encouragement given. Has NICU booklet. Plans to call insurance company to get pump. Reviewed 2 week rental if she needs one at DC. No questions at present.   Patient Name: Melinda Allen Date: 08/18/2015 Reason for consult: Initial assessment;NICU baby   Maternal Data Formula Feeding for Exclusion: No Has patient been taught Hand Expression?: Yes Does the patient have breastfeeding experience prior to this delivery?: No  Feeding    LATCH Score/Interventions                      Lactation Tools Discussed/Used WIC Program: No   Consult Status Consult Status: Follow-up Date: 08/19/15 Follow-up type: In-patient    Pamelia Hoit 08/18/2015, 2:08 PM

## 2015-08-18 NOTE — Progress Notes (Signed)
Patient is eating, ambulating, foley removed this am, has not yet urinated.  Pain control is good.  Appropriate lochia.  No complaints.  Denies CP/SOB, dizziness.  Filed Vitals:   08/17/15 1906 08/17/15 2006 08/17/15 2104 08/18/15 0553  BP: 112/76 96/65 99/60  83/52  Pulse: 87 96 88 75  Temp: 97.9 F (36.6 C) 98.2 F (36.8 C) 98.1 F (36.7 C) 98.7 F (37.1 C)  TempSrc: Oral Oral Oral Oral  Resp: Height:      Weight:      SpO2: 96% 93% 100%     Fundus firm Inc: c/d/i Ext: no CT  Lab Results  Component Value Date   WBC 12.5* 08/18/2015   HGB 6.7* 08/18/2015   HCT 20.9* 08/18/2015   MCV 63.7* 08/18/2015   PLT 261 08/18/2015    --/--/O POS (02/18 0539)  A/P Post op day #1 s/p c/s for NRFHT in setting of 33.2 PPROM  Anemia - Hb 8.6 08/02/2015, post op Hb 6.7.  Pt is asymptomatic.  FeSO4 tid and colace  Routine care.    Philip Aspen

## 2015-08-19 ENCOUNTER — Encounter (HOSPITAL_COMMUNITY): Payer: Self-pay | Admitting: Obstetrics and Gynecology

## 2015-08-19 LAB — RPR: RPR Ser Ql: NONREACTIVE

## 2015-08-19 NOTE — Progress Notes (Signed)
Patient is eating, ambulating, voiding without difficulty.  Pain control is good.  Appropriate lochia.  No complaints.  Denies CP/SOB, dizziness with ambulation.  Filed Vitals:   08/18/15 1210 08/18/15 1842 08/18/15 2153 08/19/15 0546  BP: 102/66 98/55 119/69 108/70  Pulse: 97 97 100 97  Temp: 97.7 F (36.5 C) 98.3 F (36.8 C) 98.5 F (36.9 C) 97.8 F (36.6 C)  TempSrc: Oral Oral Oral Oral  Resp: Height:      Weight:      SpO2: 99% 98% 100% 97%   NAD Fundus firm, incision c/d/i Ext: no edema  Lab Results  Component Value Date   WBC 12.5* 08/18/2015   HGB 6.7* 08/18/2015   HCT 20.9* 08/18/2015   MCV 63.7* 08/18/2015   PLT 261 08/18/2015    --/--/O POS (02/18 0539)  A/P G1P1 POD#2 s/p c/s for NRFHT in setting of 33.2 PPROM  Anemia:  ABLA on top of IDA/beta thal - Hgb fell from 8.6 to 6.7.  Pt is asymptomatic.  FeSO4 tid and colace Routine care.  Anticipate d/c tomorrow Baby doing well in NICU  [facility] GEFFEL [place]

## 2015-08-19 NOTE — Lactation Note (Signed)
This note was copied from a baby's chart. Lactation Consultation Note  Follow up visit made.  Mom just finished hand expressing a few mls of colostrum with RN assisting.  She has 2 small healing scabs on areola right breast.  Mom denies pain and feels like she had flange not centered the first day.  Instructed to use coconut oil inside flange to reduce friction.  Comfort gels also given with instructions.  Encouraged to continue pumping and hand expression every 3 hours.  Patient Name: [name] [id number] Date: 08/19/2015     Maternal Data    Feeding    LATCH Score/Interventions                      Lactation Tools Discussed/Used     Consult Status      [name] 08/19/2015, 1:13 PM

## 2015-08-19 NOTE — Progress Notes (Signed)
CSW attempted to meet with MOB in her third floor room to introduce services, offer support, and complete assessment due to baby's admission to NICU, but she was not in her room at this time.  CSW sees that MOB has a discharge order written, and therefore, will attempt to follow up with MOB when she visits infant in NICU.

## 2015-08-19 NOTE — Op Note (Signed)
Melinda Allen, Melinda Allen             ACCOUNT NO.:  [id number]  MEDICAL RECORD NO.:  [id number]  LOCATION:  9305                          FACILITY:  [facility]  PHYSICIAN:  Philip Aspen, DO    DATE OF BIRTH:  [birth year]  DATE OF PROCEDURE: DATE OF DISCHARGE:                              OPERATIVE REPORT   PREOPERATIVE DIAGNOSIS:  A 32 weeks and 2 day intrauterine pregnancy with preterm premature rupture of membranes with nonreassuring fetal heart tones.  POSTOPERATIVE DIAGNOSIS:  A 32 weeks and 2 day intrauterine pregnancy with preterm premature rupture of membranes with nonreassuring fetal heart tones.  PROCEDURE:  Low-transverse cesarean section.  SURGEON:  Philip Aspen, DO.  ANESTHESIA:  Spinal.  IV FLUIDS:  2600 mL.  URINE OUTPUT:  400 mL.  ESTIMATED BLOOD LOSS:  700 mL.  SPECIMENS:  Placenta and cord blood.  FINDINGS:  Normal uterus, bilateral tubes, and ovaries with a female infant in cephalic presentation.  Weight is 3 pounds 14 ounces and Apgars are not yet entered by Neonatology.  COMPLICATIONS:  None.  CONDITION:  Stable to PACU.  DESCRIPTION OF PROCEDURE:  The patient had a prolonged deceleration and at that time I was called and case was discussed with them and they agreed to proceeding with primary C-section at that time was appropriate.  The patient was taken to the operating room, where spinal anesthesia was administered and found to be adequate.  She was prepped and draped in normal sterile fashion in dorsal supine position.  A Pfannenstiel skin incision was made with a scalpel and carried down to underlying layer of fascia with Bovie cautery.  The fascia was incised at the midline and extended laterally with Mayo scissors.  Kocher clamps were placed at the superior aspect of the fascial incision and rectus muscles were dissected off bluntly and sharply. The were then placed at the inferior aspect of the fascial incision and again rectus muscles were  dissected off bluntly and sharply.  The amniotic sac was entered sharply and the infant's head was elevated and delivered without difficulty, followed by the remainder of the infan'ts body.  The cord was clamped and cut and the infant was handed off to awaiting neonatology. The Cord blood was collected. Placenta was delivered by gentle traction on the umbilical cord and external uterine massage.  The uterus was cleared of all clot and debris and the uterine incision was closed in two layers, with running locked 0-vicryl and secondwith horizontal lambert imbrication.  The peritoneum was reapproximated and closed with monocryl and the fascia was reapproximated and closed with vicryl in a running fashion.  Subcutaneous tissued was irrigated dried and minimal use of bovie cautery was needed for hemostasis.  The skin was reapproximated and closed with staples. The patient tolerated the procedure well.  Sponge, lap, and needle counts were correct x2.  The patient was taken to recovery in stable condition.          ______________________________ Philip Aspen, DO     Lynd/MEDQ  D:  08/18/2015  T:  08/18/2015  Job:  [id number]

## 2015-08-19 NOTE — Discharge Summary (Signed)
Obstetric Discharge Summary Reason for Admission: rupture of membranes premature Prenatal Procedures: NST, CST and ultrasound Intrapartum Procedures: cesarean: low cervical, transverse Postpartum Procedures: none Complications-Operative and Postpartum: none HEMOGLOBIN  Date Value Ref Range Status  08/18/2015 6.7* 12.0 - 15.0 g/dL Final    Comment:    REPEATED TO VERIFY CRITICAL RESULT CALLED TO, READ BACK BY AND VERIFIED WITH: K SANDERS 08/18/15 AT 0544 BY H SOEWARDIMAN    HGB  Date Value Ref Range Status  06/17/2009 10.6* 11.6 - 15.9 g/dL Final   HCT  Date Value Ref Range Status  08/18/2015 20.9* 36.0 - 46.0 % Final  06/17/2009 33.3* 34.8 - 46.6 % Final     Discharge Diagnoses: delivered for PPROM and non reassuring FHTs  Discharge Information: Date: 08/19/2015 Activity: pelvic rest Diet: routine Medications: Iron and Percocet Condition: stable Instructions: refer to practice specific booklet Discharge to: home Follow-up Information    Follow up with CALLAHAN, SIDNEY, DO. Call in 4 weeks.   Specialty:  Obstetrics and Gynecology   Contact information:   [address] [place] [address] [phone]       Newborn Data: Live born female  Birth Weight: 3 lb 14.4 oz (1770 g) APGAR: ,   NICU for prematurity.  Yosselin Zoeller A 08/19/2015, 10:48 AM

## 2015-08-20 MED ORDER — OXYCODONE-ACETAMINOPHEN 5-325 MG PO TABS: 1.0000 | ORAL_TABLET | ORAL | Status: DC | PRN

## 2015-08-20 NOTE — Progress Notes (Signed)
CSW attempted again to meet with parents who were not in MOB's room at this time.  CSW will attempt again at a later time. 

## 2015-08-20 NOTE — Lactation Note (Signed)
This note was copied from a baby's chart. Lactation Consultation Note  Follow up visit made prior to discharge.  Mom is pumping 15-20 mls.  [employer] pump rental completed.  She is wearing comfort gels and nipple abrasions healing.  Encouraged to call with concerns prn.  Patient Name: Melinda Allen [id number] Date: 08/20/2015     Maternal Data    Feeding Feeding Type: Breast Milk Length of feed: 10 min  LATCH Score/Interventions                      Lactation Tools Discussed/Used     Consult Status      [name] 08/20/2015, 1:59 PM

## 2015-08-20 NOTE — Progress Notes (Signed)
  Patient is eating, ambulating, voiding.  Pain control is good.  Filed Vitals:   08/19/15 1248 08/19/15 1918 08/19/15 2258 08/20/15 0622  BP: 115/63 123/70 114/84 105/66  Pulse: 81 75 89 87  Temp: 98.2 F (36.8 C) 98 F (36.7 C) 98 F (36.7 C) 97.9 F (36.6 C)  TempSrc: Oral Oral Oral Oral  Resp: Height:      Weight:      SpO2: 99% 100% 99% 99%    lungs:   clear to auscultation cor:    RRR Abdomen:  soft, appropriate tenderness, incisions intact and without erythema or exudate ex:    no cords   Lab Results  Component Value Date   WBC 12.5* 08/18/2015   HGB 6.7* 08/18/2015   HCT 20.9* 08/18/2015   MCV 63.7* 08/18/2015   PLT 261 08/18/2015    --/--/O POS (02/18 0539)/RI  A/P    Post operative day 3. Stable with anemia-assymptomatic.  Baby stable in NICU.  Routine post op and postpartum care.  Expect d/c today.  Percocet for pain control.

## 2015-08-20 NOTE — Progress Notes (Signed)
Pt to visit nicu  And leave teaching complete

## 2015-08-21 ENCOUNTER — Encounter (HOSPITAL_COMMUNITY): Payer: Self-pay | Admitting: *Deleted

## 2015-08-28 ENCOUNTER — Ambulatory Visit: Payer: Self-pay

## 2015-08-28 NOTE — Lactation Note (Signed)
This note was copied from a baby's chart. Lactation Consultation Note  Patient Name: Melinda Allen [id number] Date: 08/28/2015 Reason for consult: Follow-up assessment;NICU baby  NICU baby 50 days old. Called to assist with latching baby at bedside. Assisted mom to attempt to latch baby in football position to right breast. Demonstrated to mom how hold her nipple with "teacup" hold and how to use "C-hold" to support baby's head. However, baby fussy at breast, and holding his hands up and pushing away from breast. Baby also not willing to suckle this LC's gloved finger. Mom reports that she put baby to breast for the first time last night and baby only licked at breast. Discussed normal premature infant behavior at breast, and enc mom to keep offering lots of STS and attempts at breast. Mom had questions about using a NS. Discussed with mom that baby not willing to suckle finger, so baby doesn't need a shield at this time.  Mom reports that she is pumping about 6 times a day and getting less than 2 ounces from both breasts combined at each pumping. Enc mom to pump 8 times/24 hours followed by hand expression, and enc pumping 2 minutes past the last letdown. Enc mom to pump more often first this in the morning after sleeping at night--every 2 hours for at least 3 times. Mom also reports that she has not been using the pumping rooms in NICU. Discussed with mom that it is beneficial to pump before she leaves so that she is pumping sooner. Enc mom to call for assistance as needed.  Maternal Data    Feeding Feeding Type: Breast Fed Length of feed: 0 min  LATCH Score/Interventions Latch: Too sleepy or reluctant, no latch achieved, no sucking elicited. Intervention(s): Skin to skin;Teach feeding cues Intervention(s): Adjust position;Assist with latch;Breast compression  Audible Swallowing: None  Type of Nipple: Everted at rest and after stimulation  Comfort (Breast/Nipple): Soft / non-tender      Hold (Positioning): Assistance needed to correctly position infant at breast and maintain latch.  LATCH Score: 5  Lactation Tools Discussed/Used     Consult Status Consult Status: PRN    Geralynn Ochs 08/28/2015, 10:04 AM

## 2015-08-30 ENCOUNTER — Ambulatory Visit: Payer: Self-pay

## 2015-08-30 NOTE — Lactation Note (Signed)
This note was copied from a baby's chart. Lactation Consultation Note  Patient Name: Melinda Allen LKGMW'NToday's Date: 08/30/2015 Reason for consult: Follow-up assessment;NICU baby  NICU baby 813 days old. Mom reports that she is pumping at least 8 times/24 hours followed by hand expressing--beginning with the last 2 days. Mom doesn't feel like she is seeing much of an increase in EBM, but intends to keep up with the pumping schedule. Discussed power-pumping just before bedtime, and mom given galactagogue handouts--Fenugreek, Moringa, and Lactation Cookie recipe. Discussed benefits of steel-cut oats. Also discussed benefits of all the STS time mom is spending with baby. Enc mom to keep putting baby to breast as well. Discussed with mom that pumping schedule is primary, and all other implementations are secondary.   Maternal Data    Feeding Feeding Type: Breast Milk Length of feed: 35 min  LATCH Score/Interventions                      Lactation Tools Discussed/Used     Consult Status Consult Status: PRN    Geralynn OchsWILLIARD, Aniesha Haughn 08/30/2015, 11:57 AM

## 2015-09-02 ENCOUNTER — Ambulatory Visit: Payer: Self-pay

## 2015-09-02 NOTE — Lactation Note (Signed)
This note was copied from a baby's chart. Lactation Consultation Note  Follow up visit made.  Mom states pumping and milk supply is going well.  She states she obtains more from right breast and questions if this is normal.  Reassured mom this is normal.  Mom states she stopped putting baby to breast because he was doing so well with bottles.  Recommended she attempt breast 1-2 times per day and limit the time so baby does not get fatigued.  Encouraged to call with questions/concerns.  Patient Name: Melinda Imagene GurneyChristina Dechellis WUJWJ'XToday's Date: 09/02/2015     Maternal Data    Feeding Feeding Type: Breast Milk Nipple Type: Slow - flow Length of feed: 35 min (25 nipplin/ gavage)  LATCH Score/Interventions                      Lactation Tools Discussed/Used     Consult Status      Huston FoleyMOULDEN, Elzia Hott S 09/02/2015, 11:14 AM

## 2017-05-11 LAB — OB RESULTS CONSOLE HEPATITIS B SURFACE ANTIGEN: HEP B S AG: NEGATIVE

## 2017-05-11 LAB — OB RESULTS CONSOLE HIV ANTIBODY (ROUTINE TESTING): HIV: NONREACTIVE

## 2017-05-11 LAB — OB RESULTS CONSOLE GC/CHLAMYDIA
CHLAMYDIA, DNA PROBE: NEGATIVE
Gonorrhea: NEGATIVE

## 2017-05-11 LAB — OB RESULTS CONSOLE ANTIBODY SCREEN: Antibody Screen: NEGATIVE

## 2017-05-11 LAB — OB RESULTS CONSOLE ABO/RH: RH Type: POSITIVE

## 2017-05-11 LAB — OB RESULTS CONSOLE RUBELLA ANTIBODY, IGM: Rubella: IMMUNE

## 2017-05-11 LAB — OB RESULTS CONSOLE RPR: RPR: NONREACTIVE

## 2017-07-28 ENCOUNTER — Encounter (HOSPITAL_COMMUNITY): Payer: Self-pay | Admitting: *Deleted

## 2017-07-30 ENCOUNTER — Ambulatory Visit (HOSPITAL_COMMUNITY)
Admission: RE | Admit: 2017-07-30 | Discharge: 2017-07-30 | Disposition: A | Discharge status: Home / Self Care | Payer: BLUE CROSS/BLUE SHIELD | Source: Ambulatory Visit | Attending: Obstetrics and Gynecology | Admitting: Obstetrics and Gynecology

## 2017-07-30 ENCOUNTER — Other Ambulatory Visit: Payer: Self-pay

## 2017-07-30 ENCOUNTER — Encounter (HOSPITAL_COMMUNITY): Payer: Self-pay

## 2017-07-30 ENCOUNTER — Other Ambulatory Visit (HOSPITAL_COMMUNITY): Payer: Self-pay | Admitting: Obstetrics and Gynecology

## 2017-07-30 DIAGNOSIS — Q950 Balanced translocation and insertion in normal individual: Secondary | ICD-10-CM | POA: Insufficient documentation

## 2017-07-30 DIAGNOSIS — Z3A22 22 weeks gestation of pregnancy: Secondary | ICD-10-CM

## 2017-07-30 DIAGNOSIS — O3510X0 Maternal care for (suspected) chromosomal abnormality in fetus, unspecified, not applicable or unspecified: Secondary | ICD-10-CM

## 2017-07-30 DIAGNOSIS — Z3689 Encounter for other specified antenatal screening: Secondary | ICD-10-CM

## 2017-07-30 DIAGNOSIS — Z368A Encounter for antenatal screening for other genetic defects: Secondary | ICD-10-CM | POA: Insufficient documentation

## 2017-07-30 DIAGNOSIS — O351XX0 Maternal care for (suspected) chromosomal abnormality in fetus, not applicable or unspecified: Secondary | ICD-10-CM

## 2017-07-30 NOTE — Progress Notes (Signed)
Genetic Counseling  High-Risk Gestation Note  Appointment Date:  07/30/2017 Referred By: Melinda Kick, MD Date of Birth:  1975-10-04 Partner:  Melinda Allen   Pregnancy History: G2P0101 Estimated Date of Delivery: 12/03/17 Estimated Gestational Age: 82w0dAttending: MRenella Cunas MD   I met with Melinda Allen her partner, Melinda Allen for genetic counseling because of chromosome translocation detected on amniocentesis in the current pregnancy.  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  Discussed slight possibility for unbalanced rearrangement below level of standard karyotype analysis  Discussed slightly increased chance for uniparental disomy 14 (~0.8%)  Reviewed options for additional screening  Chromosome microarray analysis on previously obtained amnio sample to assess for imbalance  UPD14 studies on amnio sample- though studies may be somewhat limited given that biological maternal samples would not be available (egg donor for current pregnancy)  Ongoing ultrasound  Reviewed options for diagnostic testing, including risks, benefits, limitations and alternatives  Couple declined further testing via chromosome microarray or UPD14 studies on previously obtained amnio sample at this time  Discussed option of peripheral blood karyotype analysis for Melinda Allen to assess for 13;14 translocation- He declined at this time; couple not planning for future pregnancies  We began by reviewing the history of the current pregnancy. The current pregnancy was conceived via IVF with egg donor. The couple reported that the same egg donor has been used for each of their three pregnancies. The egg donor was 40years old at the time of egg retrieval. The first pregnancy resulted in very early pregnancy loss, and their second pregnancy together is their son, who will  be 245years old this month. Their son is reportedly in good health with no medical concerns or concerns for development. Melinda Allen first trimester screening in the current pregnancy, which indicated an increased risk for fetal trisomy, and she elected to pursue amniocentesis at that time with her OB provider. 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   We reviewed chromosomes in detail. Robertsonian translocations are the most common chromosome difference, found in approximately 1 in 1,000 individuals. This specific type of chromosome rearrangement describes when two chromosomes lose a small amount of DNA at the ends and join together at the centromere to create one large "derivative" chromosome. Robertsonian 13q14q translocation is the most common robertsonian translocation.  This small amount of genetic material that is lost is typically not associated with any clinical effects. The vast majority of individuals with a balanced reciprocal translocation are healthy and do not have any symptoms related to the translocation. However, when a person with a translocation makes egg or sperm cells, there is an increased chance for chromosomes to not divide equally.  This can result into much or too little genetic information in the fetus, which can cause miscarriage, birth defects and/or intellectual disability.    The couple understands that the translocation could have occurred de novo (not inherited) in the pregnancy, or could have been inherited from the sperm cell or egg cell. The egg donor is not available for karyotype analysis. We discussed the option of karyotype analysis for Melinda Allen. We discussed that for their additional children, karyotype analysis would be available when they are of reproductive age, if desired but would not be indicated in the absence of clinical symptoms.  Chromosome analysis is  available to her relatives, who are of reproductive age, if  desired. The couple understands that chromosome testing for the robertsonian translocation for their daughter would not be indicated at this time but would be available to her, if desired, when she is of reproductive age.   We reviewed chromosomes and meiotic segregation patterns related to reproductive risks for balanced translocation carriers.  Each pregnancy of a Robertsonian translocation carrier has a chance for each of the following outcomes:  (1) A pregnancy could inherit a normal freestanding copy of chromosomes 13 and 14 from the carrier parent and have a normal chromosome constitution.  (2) The baby could be a balanced carrier of the same 13q14q Robertsonian translocation just like the carrier parent.  (3) A pregnancy could inherit an extra copy of chromosome 13, leading to translocation Trisomy 65.  (4)  A pregnancy could inherit an extra copy of chromosome 14 (Trisomy 14), a missing copy of chromosome 13 (Monosomy 13) or a missing copy of chromosome 14 (Monosomy 14).  These conditions are not viable and would be expected to result in a miscarriage. (5)  There is also an increased chance for a condition called uniparental disomy (UPD) of chromosome 14.   We discussed that if the 13;14 translocation was inherited from a balanced carrier, then the karyotype would be expected to reflect a balanced 13;14 translocation. We dicussed that if the translocation in the current pregnancy is de novo, it is possible that there may be deleted or duplicated material below the level of karyotype analysis. While this is expected to be a low possibility, we discussed the option of reflex analysis on the amniocentesis sample with chromosomal microarray analysis to assess for submicroscopic chromosome imbalances. The couple declined chromosome microarray analysis at the time of today's visit.   We spent time discussing the chance for uniparental disomy, which  can occur when a pregnancy with an extra (trisomy) or missing (monosomy) chromosome condition tries to "correct" this imbalance and lose one copy of the extra chromosome in a trisomy (trisomy rescue) or replicate the single copy in a monosomy (monosomy rescue).  These situations may produce a uniparental disomy (UPD) of chromosome 39, meaning that the pregnancy will receive two copies of a single chromosome from one parent and none from the other parent, instead of one from each parent.   There are two possible UPD conditions that could result regarding chromosome 14.  Maternal UPD 14 is associated with prenatal and postnatal growth retardation, characteristic facial features, and normal to delayed development.  Paternal UPD 36 is more severe and presents with polyhydramnios and preterm labor, growth retardation, a pattern of birth defects, and major neurological problems.  We discussed the UPD14 has been described with 13;14 translocations both when inherited from a parent and when the translocation occurs de novo. We discussed that the risk estimates for UPD14 with 13;14 translocation range in the literature from 0.6% (Shaffer 2006) to 3% Lonni Fix et al. 2004). A slightly higher rate of UPD was observed in de novo cases versus maternally or paternally inherited cases. However, an overall point risk estimate of 0.8% has been reported for UPD in cases of prenatally diagnosed Robertsonian translocation.   We discussed that UPD14 studies are also available on sample obtained from amniocentesis. While, these studies are ideally pursued with both paternal and biological maternal samples, studies could still be pursued with paternal and amniocyte samples, though information provided may be more limited. After careful consideration, the couple declined pursuing prenatal UPD14 studies from amniocytes.   We reviewed the benefits and limitations  of detailed ultrasound in pregnancy to assess fetal growth and anatomy and  to screen for potential physical features of chromosome imbalances and some possible features of UPD14. They understand that ultrasound cannot diagnose or rule out all birth defects or genetic conditions. Detailed ultrasound was performed at today's visit. Visualized fetal anatomy was within normal limits. Complete ultrasound results reported under separate cover. We discussed the additional screening option of follow-up ultrasound in the third trimester to assess fetal growth and amniotic fluid.   Both family histories were reviewed and found to be otherwise noncontributory for birth defects, intellectual disability, and known genetic conditions. Without further information regarding the provided family history, an accurate genetic risk cannot be calculated. Further genetic counseling is warranted if more information is obtained.  Ms. TRELLA THURMOND denied exposure to environmental toxins or chemical agents. She denied the use of alcohol, tobacco or street drugs. She denied significant viral illnesses during the course of her pregnancy.   I counseled this couple regarding the above risks and available options.  The approximate face-to-face time with the genetic counselor was 45 minutes.  Chipper Oman, MS Certified Genetic Counselor 07/30/2017

## 2017-08-03 DIAGNOSIS — Z3A22 22 weeks gestation of pregnancy: Secondary | ICD-10-CM | POA: Insufficient documentation

## 2017-09-22 ENCOUNTER — Inpatient Hospital Stay (HOSPITAL_COMMUNITY)
Admission: AD | Admit: 2017-09-22 | Discharge: 2017-09-22 | Disposition: A | Discharge status: Home / Self Care | Payer: BLUE CROSS/BLUE SHIELD | Source: Ambulatory Visit | Attending: Obstetrics and Gynecology | Admitting: Obstetrics and Gynecology

## 2017-09-22 ENCOUNTER — Other Ambulatory Visit: Payer: Self-pay

## 2017-09-22 ENCOUNTER — Encounter (HOSPITAL_COMMUNITY): Payer: Self-pay | Admitting: *Deleted

## 2017-09-22 DIAGNOSIS — O09893 Supervision of other high risk pregnancies, third trimester: Secondary | ICD-10-CM

## 2017-09-22 DIAGNOSIS — Z87891 Personal history of nicotine dependence: Secondary | ICD-10-CM | POA: Insufficient documentation

## 2017-09-22 DIAGNOSIS — O479 False labor, unspecified: Secondary | ICD-10-CM | POA: Diagnosis not present

## 2017-09-22 DIAGNOSIS — O26893 Other specified pregnancy related conditions, third trimester: Secondary | ICD-10-CM | POA: Insufficient documentation

## 2017-09-22 DIAGNOSIS — Z7982 Long term (current) use of aspirin: Secondary | ICD-10-CM | POA: Diagnosis not present

## 2017-09-22 DIAGNOSIS — O4693 Antepartum hemorrhage, unspecified, third trimester: Secondary | ICD-10-CM | POA: Insufficient documentation

## 2017-09-22 DIAGNOSIS — Z3A29 29 weeks gestation of pregnancy: Secondary | ICD-10-CM | POA: Diagnosis not present

## 2017-09-22 DIAGNOSIS — O09213 Supervision of pregnancy with history of pre-term labor, third trimester: Secondary | ICD-10-CM

## 2017-09-22 DIAGNOSIS — Q950 Balanced translocation and insertion in normal individual: Secondary | ICD-10-CM

## 2017-09-22 DIAGNOSIS — O4703 False labor before 37 completed weeks of gestation, third trimester: Secondary | ICD-10-CM | POA: Insufficient documentation

## 2017-09-22 LAB — URINALYSIS, ROUTINE W REFLEX MICROSCOPIC
Bacteria, UA: NONE SEEN
Bilirubin Urine: NEGATIVE
GLUCOSE, UA: NEGATIVE mg/dL
Ketones, ur: NEGATIVE mg/dL
Leukocytes, UA: NEGATIVE
Nitrite: NEGATIVE
PROTEIN: NEGATIVE mg/dL
Specific Gravity, Urine: 1.002 — ABNORMAL LOW (ref 1.005–1.030)
pH: 7 (ref 5.0–8.0)

## 2017-09-22 LAB — POCT FERN TEST: POCT Fern Test: NEGATIVE

## 2017-09-22 MED ORDER — LACTATED RINGERS IV BOLUS
1000.0000 mL | Freq: Once | INTRAVENOUS | Status: AC
Start: 2017-09-22 — End: 2017-09-22
  Administered 2017-09-22: 1000 mL via INTRAVENOUS

## 2017-09-22 MED ORDER — NIFEDIPINE 10 MG PO CAPS
10.0000 mg | ORAL_CAPSULE | ORAL | Status: AC | PRN
  Administered 2017-09-22 (×3): 10 mg via ORAL
  Filled 2017-09-22 (×3): qty 1

## 2017-09-22 NOTE — MAU Note (Signed)
Pt presents with c/o LOF @ 0530 this morning.  Reports saw pink tinged fluid, but hasn't leaked since 0600.  Denies uterine ctxs, VB.  Reports +FM. Hx of PPROM @ 31 weeks, delivered @ 33+ weeks with previous pregnancy.

## 2017-09-22 NOTE — MAU Provider Note (Signed)
Chief Complaint:  Rupture of Membranes   First Provider Initiated Contact with Patient 09/22/17 0957      HPI: Melinda Allen is a 42 y.o. G2P0101 at [redacted]w[redacted]d who presents to maternity admissions reporting leakage of pink fluid with mucus this morning. She denies any associated pain.  The fluid was enough to wet her underwear but not requiring a pad.  There are no other associated symptoms.  She has not tried any treatments. She reports good fetal movement, denies, urinary symptoms, h/a, dizziness, n/v, or fever/chills.    HPI  Past Medical History: Past Medical History:  Diagnosis Date  . Hypertension   . Thalassemia, unspecified     Past obstetric history: OB History  Gravida Para Term Preterm AB Living  2 1   1   1   SAB TAB Ectopic Multiple Live Births        0 1    # Outcome Date GA Lbr Len/2nd Weight Sex Delivery Anes PTL Lv  2 Current           1 Preterm 08/17/15 [redacted]w[redacted]d  3 lb 14.4 oz (1.77 kg) M CS-LTranv Spinal  LIV    Past Surgical History: Past Surgical History:  Procedure Laterality Date  . CESAREAN SECTION N/A 08/17/2015   Procedure: CESAREAN SECTION;  Surgeon: Philip Aspen, DO;  Location: [facility];  Service: Obstetrics;  Laterality: N/A;    Family History: History reviewed. No pertinent family history.  Social History: Social History   Tobacco Use  . Smoking status: Former Smoker    Last attempt to quit: 03/01/2014    Years since quitting: 3.5  . Smokeless tobacco: Never Used  Substance Use Topics  . Alcohol use: No  . Drug use: No    Allergies: No Known Allergies  Meds:  Medications Prior to Admission  Medication Sig Dispense Refill Last Dose  . aspirin 81 MG tablet Take 81 mg by mouth daily.   09/21/2017 at Unknown time  . ferrous sulfate 325 (65 FE) MG tablet Take 325 mg by mouth at bedtime.    09/21/2017 at Unknown time  . hydroxyprogesterone caproate (MAKENA) 250 mg/mL OIL injection Inject 250 mg into the muscle once a week.   09/19/2017  .  labetalol (NORMODYNE) 100 MG tablet Take 50 mg by mouth 2 (two) times daily.   09/22/2017 at 0700  . loratadine (CLARITIN) 10 MG tablet Take 10 mg by mouth daily as needed for allergies.   09/20/2017  . Prenatal Vit-Fe Fumarate-FA (MULTIVITAMIN-PRENATAL) 27-0.8 MG TABS tablet Take 1 tablet by mouth daily at 12 noon.   09/21/2017 at Unknown time  . methyldopa (ALDOMET) 250 MG tablet Take 250 mg by mouth 2 (two) times daily.   Taking    ROS:  Review of Systems  Constitutional: Negative for chills, fatigue and fever.  Respiratory: Negative for shortness of breath.   Cardiovascular: Negative for chest pain.  Gastrointestinal: Negative for abdominal pain.  Genitourinary: Positive for vaginal bleeding and vaginal discharge. Negative for difficulty urinating, dysuria, flank pain, pelvic pain and vaginal pain.  Neurological: Negative for dizziness and headaches.  Psychiatric/Behavioral: Negative.      I have reviewed patient's Past Medical Hx, Surgical Hx, Family Hx, Social Hx, medications and allergies.   Physical Exam   Patient Vitals for the past 24 hrs:  BP Temp Temp src Pulse Resp SpO2 Height Weight  09/22/17 1140 99/60 - - (!) 115 - - - -  09/22/17 1113 119/77 - - 97 - - - -  09/22/17 1053 131/83 - - 100 - - - -  09/22/17 0922 126/82 - - (!) 114 - - - -  09/22/17 0901 (!) 133/96 98.7 F (37.1 C) Oral (!) 129 18 98 % 4\' 11"  (1.499 m) 166 lb 4 oz (75.4 kg)   Constitutional: Well-developed, well-nourished female in no acute distress.  Cardiovascular: normal rate Respiratory: normal effort GI: Abd soft, non-tender, gravid appropriate for gestational age.  MS: Extremities nontender, no edema, normal ROM Neurologic: Alert and oriented x 4.  GU: Neg CVAT.  PELVIC EXAM: Cervix pink, visually closed, without lesion, small amount blood tinged mucous, no pooling of fluid with Valsalva, vaginal walls and external genitalia normal  Dilation: Closed Effacement (%): Thick Exam by:: L.  Leftwich, CNM  FHT:  Baseline 145 , moderate variability, accelerations present, no decelerations Contractions: q 2 mins, mild to palpation   Labs: Results for orders placed or performed during the hospital encounter of 09/22/17 (from the past 24 hour(s))  Urinalysis, Routine w reflex microscopic     Status: Abnormal   Collection Time: 09/22/17  9:05 AM  Result Value Ref Range   Color, Urine COLORLESS (A) YELLOW   APPearance CLEAR CLEAR   Specific Gravity, Urine 1.002 (L) 1.005 - 1.030   pH 7.0 5.0 - 8.0   Glucose, UA NEGATIVE NEGATIVE mg/dL   Hgb urine dipstick SMALL (A) NEGATIVE   Bilirubin Urine NEGATIVE NEGATIVE   Ketones, ur NEGATIVE NEGATIVE mg/dL   Protein, ur NEGATIVE NEGATIVE mg/dL   Nitrite NEGATIVE NEGATIVE   Leukocytes, UA NEGATIVE NEGATIVE   RBC / HPF 0-5 0 - 5 RBC/hpf   WBC, UA 0-5 0 - 5 WBC/hpf   Bacteria, UA NONE SEEN NONE SEEN   Squamous Epithelial / LPF 0-5 (A) NONE SEEN  Fern Test     Status: None   Collection Time: 09/22/17 10:46 AM  Result Value Ref Range   POCT Fern Test Negative = intact amniotic membranes       Imaging:  No results found.  MAU Course/MDM: I have ordered labs and reviewed results.  NST reviewed and reactive Pt denies feeling ctx but irritability/contractions on toco every 1-2 minutes LR x 1000 ml and Procardia 10 mg Q 20 min x 3 doses given Pt continues to report she feels well, no cramping but irritability still noted on toco Cervix unchanged in 3+ hours in MAU remains closed and thick Consult Dr Claiborne Billingsallahan with presentation, exam findings and test results.  Pt discharge with strict preterm labor precautions.  Today's evaluation included a work-up for preterm labor which can be life-threatening for both mom and baby.  Assessment: 1. Braxton Hicks contractions   2. Balanced chromosomal translocation   3. Hx of preterm delivery, currently pregnant, third trimester   4. Vaginal bleeding in pregnancy, third trimester      Plan: Discharge home Labor precautions and fetal kick counts Follow-up Information    [facility]   Why:  Call for follow up appointment on Monday. Return to MAU for signs of labor or emergencies. Contact information: [address] [place] [address] [phone]          Allergies as of 09/22/2017   No Known Allergies     Medication List    STOP taking these medications   methyldopa 250 MG tablet Commonly known as:  ALDOMET     TAKE these medications   aspirin 81 MG tablet Take 81 mg by mouth daily.  ferrous sulfate 325 (65 FE) MG tablet Take 325 mg by mouth at bedtime.   hydroxyprogesterone caproate 250 mg/mL Oil injection Commonly known as:  MAKENA Inject 250 mg into the muscle once a week.   labetalol 100 MG tablet Commonly known as:  NORMODYNE Take 50 mg by mouth 2 (two) times daily.   loratadine 10 MG tablet Commonly known as:  CLARITIN Take 10 mg by mouth daily as needed for allergies.   multivitamin-prenatal 27-0.8 MG Tabs tablet Take 1 tablet by mouth daily at 12 noon.       Sharen Counter Certified Nurse-Midwife 09/22/2017 1:22 PM

## 2017-10-20 ENCOUNTER — Inpatient Hospital Stay (HOSPITAL_COMMUNITY)
Admission: AD | Admit: 2017-10-20 | Discharge: 2017-10-20 | Disposition: A | Discharge status: Home / Self Care | Payer: BLUE CROSS/BLUE SHIELD | Source: Ambulatory Visit | Attending: Obstetrics and Gynecology | Admitting: Obstetrics and Gynecology

## 2017-10-20 ENCOUNTER — Encounter (HOSPITAL_COMMUNITY): Payer: Self-pay | Admitting: *Deleted

## 2017-10-20 ENCOUNTER — Inpatient Hospital Stay (HOSPITAL_COMMUNITY): Payer: BLUE CROSS/BLUE SHIELD

## 2017-10-20 DIAGNOSIS — Z9889 Other specified postprocedural states: Secondary | ICD-10-CM | POA: Diagnosis not present

## 2017-10-20 DIAGNOSIS — Z87891 Personal history of nicotine dependence: Secondary | ICD-10-CM | POA: Insufficient documentation

## 2017-10-20 DIAGNOSIS — O10913 Unspecified pre-existing hypertension complicating pregnancy, third trimester: Secondary | ICD-10-CM

## 2017-10-20 DIAGNOSIS — O4693 Antepartum hemorrhage, unspecified, third trimester: Secondary | ICD-10-CM | POA: Diagnosis not present

## 2017-10-20 DIAGNOSIS — Z3A33 33 weeks gestation of pregnancy: Secondary | ICD-10-CM

## 2017-10-20 DIAGNOSIS — O4690 Antepartum hemorrhage, unspecified, unspecified trimester: Secondary | ICD-10-CM

## 2017-10-20 DIAGNOSIS — O4413 Placenta previa with hemorrhage, third trimester: Secondary | ICD-10-CM | POA: Insufficient documentation

## 2017-10-20 DIAGNOSIS — Z6790 Unspecified blood type, Rh positive: Secondary | ICD-10-CM

## 2017-10-20 DIAGNOSIS — O99013 Anemia complicating pregnancy, third trimester: Secondary | ICD-10-CM | POA: Diagnosis not present

## 2017-10-20 DIAGNOSIS — Z3689 Encounter for other specified antenatal screening: Secondary | ICD-10-CM

## 2017-10-20 DIAGNOSIS — Z7982 Long term (current) use of aspirin: Secondary | ICD-10-CM | POA: Insufficient documentation

## 2017-10-20 LAB — CBC
HCT: 29.9 % — ABNORMAL LOW (ref 36.0–46.0)
Hemoglobin: 9.4 g/dL — ABNORMAL LOW (ref 12.0–15.0)
MCH: 21 pg — ABNORMAL LOW (ref 26.0–34.0)
MCHC: 31.4 g/dL (ref 30.0–36.0)
MCV: 66.7 fL — AB (ref 78.0–100.0)
PLATELETS: 245 10*3/uL (ref 150–400)
RBC: 4.48 MIL/uL (ref 3.87–5.11)
RDW: 18.3 % — AB (ref 11.5–15.5)
WBC: 10.4 10*3/uL (ref 4.0–10.5)

## 2017-10-20 MED ORDER — BETAMETHASONE SOD PHOS & ACET 6 (3-3) MG/ML IJ SUSP
12.0000 mg | INTRAMUSCULAR | Status: DC
  Administered 2017-10-20: 12 mg via INTRAMUSCULAR
  Filled 2017-10-20: qty 2

## 2017-10-20 NOTE — MAU Note (Signed)
urine in lab 

## 2017-10-20 NOTE — Discharge Instructions (Signed)
Vaginal Bleeding During Pregnancy, Third Trimester A small amount of bleeding (spotting) from the vagina is relatively common in pregnancy. Various things can cause bleeding or spotting in pregnancy. Sometimes the bleeding is normal and is not a problem. However, bleeding during the third trimester can also be a sign of something serious for the mother and the baby. Be sure to tell your health care provider about any vaginal bleeding right away. Some possible causes of vaginal bleeding during the third trimester include:  The placenta may be partially or completely covering the opening to the cervix (placenta previa).  The placenta may have separated from the uterus (abruption of the placenta).  There may be an infection or growth on the cervix.  You may be starting labor, called discharging of the mucus plug.  The placenta may grow into the muscle layer of the uterus (placenta accreta).  Follow these instructions at home: Watch your condition for any changes. The following actions may help to lessen any discomfort you are feeling:  Follow your health care provider's instructions for limiting your activity. If your health care provider orders bed rest, you may need to stay in bed and only get up to use the bathroom. However, your health care provider may allow you to continue light activity.  If needed, make plans for someone to help with your regular activities and responsibilities while you are on bed rest.  Keep track of the number of pads you use each day, how often you change pads, and how soaked (saturated) they are. Write this down.  Do not use tampons. Do not douche.  Do not have sexual intercourse or orgasms until approved by your health care provider.  Follow your health care provider's advice about lifting, driving, and physical activities.  If you pass any tissue from your vagina, save the tissue so you can show it to your health care provider.  Only take over-the-counter  or prescription medicines as directed by your health care provider.  Do not take aspirin because it can make you bleed.  Keep all follow-up appointments as directed by your health care provider.  Contact a health care provider if:  You have any vaginal bleeding during any part of your pregnancy.  You have cramps or labor pains.  You have a fever, not controlled by medicine. Get help right away if:  You have severe cramps or pain in your back or belly (abdomen).  You have chills.  You have a gush of fluid from the vagina.  You pass large clots or tissue from your vagina.  Your bleeding increases.  You feel light-headed or weak.  You pass out.  You feel less movement or no movement of the baby. This information is not intended to replace advice given to you by your health care provider. Make sure you discuss any questions you have with your health care provider. Document Released: 09/05/2002 Document Revised: 11/21/2015 Document Reviewed: 02/20/2013 Elsevier Interactive Patient Education  2018 [employer] Fetal Movement Counts Patient Name: ________________________________________________ Patient Due Date: ____________________ What is a fetal movement count? A fetal movement count is the number of times that you feel your baby move during a certain amount of time. This may also be called a fetal kick count. A fetal movement count is recommended for every pregnant woman. You may be asked to start counting fetal movements as early as week 28 of your pregnancy. Pay attention to when your baby is most active. You may notice your baby's sleep and  wake cycles. You may also notice things that make your baby move more. You should do a fetal movement count:  When your baby is normally most active.  At the same time each day.  A good time to count movements is while you are resting, after having something to eat and drink. How do I count fetal movements? 1. Find a quiet,  comfortable area. Sit, or lie down on your side. 2. Write down the date, the start time and stop time, and the number of movements that you felt between those two times. Take this information with you to your health care visits. 3. For 2 hours, count kicks, flutters, swishes, rolls, and jabs. You should feel at least 10 movements during 2 hours. 4. You may stop counting after you have felt 10 movements. 5. If you do not feel 10 movements in 2 hours, have something to eat and drink. Then, keep resting and counting for 1 hour. If you feel at least 4 movements during that hour, you may stop counting. Contact a health care provider if:  You feel fewer than 4 movements in 2 hours.  Your baby is not moving like he or she usually does. Date: ____________ Start time: ____________ Stop time: ____________ Movements: ____________ Date: ____________ Start time: ____________ Stop time: ____________ Movements: ____________ Date: ____________ Start time: ____________ Stop time: ____________ Movements: ____________ Date: ____________ Start time: ____________ Stop time: ____________ Movements: ____________ Date: ____________ Start time: ____________ Stop time: ____________ Movements: ____________ Date: ____________ Start time: ____________ Stop time: ____________ Movements: ____________ Date: ____________ Start time: ____________ Stop time: ____________ Movements: ____________ Date: ____________ Start time: ____________ Stop time: ____________ Movements: ____________ Date: ____________ Start time: ____________ Stop time: ____________ Movements: ____________ This information is not intended to replace advice given to you by your health care provider. Make sure you discuss any questions you have with your health care provider. Document Released: 07/15/2006 Document Revised: 02/12/2016 Document Reviewed: 07/25/2015 Elsevier Interactive Patient Education  [employer].

## 2017-10-20 NOTE — MAU Provider Note (Signed)
History     CSN: [id number]  Arrival date and time: 10/20/17 7829  First Provider Initiated Contact with Patient 10/20/17 [phone]     Chief Complaint  Patient presents with  . Vaginal Bleeding   G2P0101 @33 .5 wks here with VB. Reports having a moderate amt of bright red bleeding around 0545 today. Soaked about 1/3 of peripad. Bleeding has slowed since. Denies pain or ctx. No LOF. Reports good FM. No recent IC or anything in vagina. Her pregnancy is complicated by [facility], placenta previa, and previous PTD. She reports the previa is resolved, no record to verify this.  OB History    Gravida  2   Para  1   Term      Preterm  1   AB      Living  1     SAB      TAB      Ectopic      Multiple  0   Live Births  1           Past Medical History:  Diagnosis Date  . Hypertension   . Thalassemia, unspecified     Past Surgical History:  Procedure Laterality Date  . CESAREAN SECTION N/A 08/17/2015   Procedure: CESAREAN SECTION;  Surgeon: Philip Aspen, DO;  Location: [facility];  Service: Obstetrics;  Laterality: N/A;    History reviewed. No pertinent family history.  Social History   Tobacco Use  . Smoking status: Former Smoker    Last attempt to quit: 03/01/2014    Years since quitting: 3.6  . Smokeless tobacco: Never Used  Substance Use Topics  . Alcohol use: No  . Drug use: No    Allergies: No Known Allergies  Medications Prior to Admission  Medication Sig Dispense Refill Last Dose  . aspirin 81 MG tablet Take 81 mg by mouth at bedtime.    10/19/2017 at Unknown time  . ferrous sulfate 325 (65 FE) MG tablet Take 325 mg by mouth at bedtime.    10/19/2017 at Unknown time  . labetalol (NORMODYNE) 100 MG tablet Take 50 mg by mouth 2 (two) times daily.   10/20/2017 at 0630  . loratadine (CLARITIN) 10 MG tablet Take 10 mg by mouth daily as needed for allergies.   10/19/2017 at Unknown time  . Prenatal Vit-Fe Fumarate-FA (MULTIVITAMIN-PRENATAL) 27-0.8 MG TABS tablet  Take 1 tablet by mouth at bedtime.    10/19/2017 at Unknown time  . hydroxyprogesterone caproate (MAKENA) 250 mg/mL OIL injection Inject 250 mg into the muscle once a week.   10/17/2017    Review of Systems  Eyes: Negative for visual disturbance.  Gastrointestinal: Negative for abdominal pain.  Genitourinary: Positive for vaginal bleeding.  Neurological: Negative for headaches.   Physical Exam   Blood pressure (!) 125/91, pulse (!) 118, temperature 97.6 F (36.4 C), temperature source Oral, resp. rate 18, height 4\' 11"  (1.499 m), weight 174 lb 1.9 oz (79 kg), unknown if currently breastfeeding. Patient Vitals for the past 24 hrs:  BP Temp Temp src Pulse Resp Height Weight  10/20/17 0929 (!) 125/91 - - (!) 118 - - -  10/20/17 0831 (!) 128/95 - - (!) 126 - - -  10/20/17 0816 (!) 131/96 - - (!) 117 - - -  10/20/17 0810 (!) 131/102 - - (!) 121 - - -  10/20/17 0801 (!) 132/101 97.6 F (36.4 C) Oral (!) 126 18 - -  10/20/17 0752 - - - - - 4\' 11"  (1.499  m) 174 lb 1.9 oz (79 kg)    Physical Exam  Constitutional: She is oriented to person, place, and time. She appears well-developed and well-nourished. No distress.  HENT:  Head: Normocephalic and atraumatic.  Neck: Normal range of motion.  Cardiovascular: Normal rate.  Respiratory: Effort normal. No respiratory distress.  GI: Soft. She exhibits no distension. There is no tenderness.  gravid  Genitourinary:  Genitourinary Comments: External: no lesions or erythema Vagina: rugated, pink, moist, scant drk red discharge Cervix visually closed   Musculoskeletal: Normal range of motion.  Neurological: She is alert and oriented to person, place, and time.  Skin: Skin is warm and dry.  Psychiatric: She has a normal mood and affect.  EFM: 145 bpm, mod variability, + accels, no decels Toco: irritability  Results for orders placed or performed during the hospital encounter of 10/20/17 (from the past 24 hour(s))  CBC     Status: Abnormal    Collection Time: 10/20/17  8:50 AM  Result Value Ref Range   WBC 10.4 4.0 - 10.5 K/uL   RBC 4.48 3.87 - 5.11 MIL/uL   Hemoglobin 9.4 (L) 12.0 - 15.0 g/dL   HCT 47.829.9 (L) 29.536.0 - 62.146.0 %   MCV 66.7 (L) 78.0 - 100.0 fL   MCH 21.0 (L) 26.0 - 34.0 pg   MCHC 31.4 30.0 - 36.0 g/dL   RDW 30.818.3 (H) 65.711.5 - 84.615.5 %   Platelets 245 150 - 400 K/uL   [place] Mfm Ob Limited  Result Date: 10/20/2017 ----------------------------------------------------------------------  OBSTETRICS REPORT                      (Signed Final 10/20/2017 10:18 am) ---------------------------------------------------------------------- Patient Info  ID #:       [id number]                          D.O.B.:  Oct 28, 1975 (42 yrs)  Name:       Melinda Allen               Visit Date: 10/20/2017 09:32 am ---------------------------------------------------------------------- Performed By  Performed By:     Eden Lathearrie Stalter BS      Ref. Address:     [address]                    [address]                                                             Road                                                             [address]                                                             [place], [place]  [address]  Attending:        Durwin Nora       Location:         [facility]                    MD  Referred By:      Waynard Reeds MD ---------------------------------------------------------------------- Orders   #  Description                                 Code   1  [place] MFM OB LIMITED                           [phone]  ----------------------------------------------------------------------   #  Ordered By               Order #        Accession #    Episode #   1  Donette Larry          [id number]      [id number]     [id number]  ---------------------------------------------------------------------- Indications   [redacted] weeks gestation of pregnancy                Z3A.35   Advanced maternal age  multigravida 19+,        O60.523   third trimester (balanced Robertsonian)   Hypertension - Chronic/Pre-existing            O10.019   History of cesarean delivery, currently        O34.219   pregnant   Poor obstetric history (prior PPROM at 2      O09.219   weeks, then decels at 33 weeks)   Placenta previa specified as without           O44.03   hemorrhage, third trimester  ---------------------------------------------------------------------- OB History  Blood Type:            Height:  4'11"  Weight (lb):  162       BMI:  32.72  Gravidity:    2         Prem:   1  Living:       1 ---------------------------------------------------------------------- Fetal Evaluation  Num Of Fetuses:     1  Fetal Heart         133  Rate(bpm):  Cardiac Activity:   Observed  Presentation:       Cephalic  Placenta:           Anterior, above cervical os  P. Cord Insertion:  Previously Visualized  Amniotic Fluid  AFI FV:      Subjectively within normal limits  AFI Sum(cm)     %Tile       Largest Pocket(cm)  11.8            32          4.91  RUQ(cm)       RLQ(cm)       LUQ(cm)        LLQ(cm)  2.06          3.68          4.91           1.15  Comment:    No placental abruption or previa identified. ---------------------------------------------------------------------- Gestational Age  Best:          33w 5d     Det.  ByMurtis Sink Transfer          EDD:   12/03/17 ---------------------------------------------------------------------- Cervix Uterus Adnexa  Cervix  Not visualized (advanced GA >29wks)  Uterus  No abnormality visualized.  Left Ovary  Not visualized.  Right Ovary  Not visualized.  Cul De Sac:   No free fluid seen.  Adnexa:       No abnormality visualized. ---------------------------------------------------------------------- Impression  Single living intrauterine pregnancy at 33w 5d (remote read of  limited ultrasound evaluation as requested)  Cephalic presentation.  Placenta Anterior, above cervical os.  Normal amniotic fluid  volume.   no sonographic evidence of abruption or previa ---------------------------------------------------------------------- Recommendations  Given HTN, continue antenatal testing and serial growth.  Otherwise, follow up and management as clinically indicated  in the interim. ----------------------------------------------------------------------               Durwin Nora, MD Electronically Signed Final Report   10/20/2017 10:18 am ----------------------------------------------------------------------  MAU Course  Procedures Betamethasone  MDM Labs and [place] ordered and reviewed. No evidence of abruption, previa, or PTL. Presentation, clinical findings, and plan discussed with Dr. Tenny Craw. Plan to f/u in office in 2 days for rpt Korea and BP. Stable for discharge home.   Assessment and Plan   1. [redacted] weeks gestation of pregnancy   2. Vaginal bleeding in pregnancy   3. Blood type, Rh positive   4.      Chronic HTN affecting pregnancy 5.      Reactive NST  Discharge home Follow up in MAU tomorrow for BMZ #2 Follow up at [facility] in 2 days-office will call with appt [facility] Bleeding/return precautions  Allergies as of 10/20/2017   No Known Allergies     Medication List    STOP taking these medications   hydroxyprogesterone caproate 250 mg/mL Oil injection Commonly known as:  MAKENA     TAKE these medications   aspirin 81 MG tablet Take 81 mg by mouth at bedtime.   ferrous sulfate 325 (65 FE) MG tablet Take 325 mg by mouth at bedtime.   labetalol 100 MG tablet Commonly known as:  NORMODYNE Take 50 mg by mouth 2 (two) times daily.   loratadine 10 MG tablet Commonly known as:  CLARITIN Take 10 mg by mouth daily as needed for allergies.   multivitamin-prenatal 27-0.8 MG Tabs tablet Take 1 tablet by mouth at bedtime.      Donette Larry, CNM 10/20/2017, 10:41 AM

## 2017-10-20 NOTE — MAU Note (Addendum)
Pt states she woke up @ 0545 & felt like she was bleeding, went to BR, noted blood on pad & on toilet tissue.  Bleeding is less now.  Hx of low lying placenta that resolved.  Denies pain, had a few contractions yesterday.  Reports good fetal movement. Hx of CHTN.

## 2017-10-21 ENCOUNTER — Inpatient Hospital Stay (HOSPITAL_COMMUNITY)
Admission: AD | Admit: 2017-10-21 | Discharge: 2017-10-21 | Disposition: A | Discharge status: Home / Self Care | Payer: BLUE CROSS/BLUE SHIELD | Source: Ambulatory Visit | Attending: Obstetrics | Admitting: Obstetrics

## 2017-10-21 DIAGNOSIS — Z3A34 34 weeks gestation of pregnancy: Secondary | ICD-10-CM | POA: Insufficient documentation

## 2017-10-21 DIAGNOSIS — O42913 Preterm premature rupture of membranes, unspecified as to length of time between rupture and onset of labor, third trimester: Secondary | ICD-10-CM | POA: Insufficient documentation

## 2017-10-21 MED ORDER — BETAMETHASONE SOD PHOS & ACET 6 (3-3) MG/ML IJ SUSP
12.0000 mg | Freq: Once | INTRAMUSCULAR | Status: AC
  Administered 2017-10-21: 12 mg via INTRAMUSCULAR
  Filled 2017-10-21: qty 2

## 2017-10-21 NOTE — MAU Note (Signed)
Pt here for second  Betamethasone injection

## 2017-10-22 ENCOUNTER — Encounter (HOSPITAL_COMMUNITY): Payer: Self-pay | Admitting: *Deleted

## 2017-10-22 ENCOUNTER — Inpatient Hospital Stay (HOSPITAL_COMMUNITY)
Admission: AD | Admit: 2017-10-22 | Discharge: 2017-10-22 | Disposition: A | Discharge status: Other Acute Inpatient Hospital | Payer: BLUE CROSS/BLUE SHIELD | Source: Ambulatory Visit | Attending: Obstetrics | Admitting: Obstetrics

## 2017-10-22 DIAGNOSIS — I10 Essential (primary) hypertension: Secondary | ICD-10-CM | POA: Diagnosis present

## 2017-10-22 DIAGNOSIS — Q950 Balanced translocation and insertion in normal individual: Secondary | ICD-10-CM | POA: Diagnosis not present

## 2017-10-22 DIAGNOSIS — Z3A34 34 weeks gestation of pregnancy: Secondary | ICD-10-CM | POA: Insufficient documentation

## 2017-10-22 DIAGNOSIS — O42913 Preterm premature rupture of membranes, unspecified as to length of time between rupture and onset of labor, third trimester: Secondary | ICD-10-CM | POA: Insufficient documentation

## 2017-10-22 DIAGNOSIS — O34219 Maternal care for unspecified type scar from previous cesarean delivery: Secondary | ICD-10-CM

## 2017-10-22 DIAGNOSIS — O42919 Preterm premature rupture of membranes, unspecified as to length of time between rupture and onset of labor, unspecified trimester: Secondary | ICD-10-CM | POA: Diagnosis present

## 2017-10-22 LAB — POCT FERN TEST: POCT FERN TEST: POSITIVE

## 2017-10-22 LAB — GROUP B STREP BY PCR: Group B strep by PCR: NEGATIVE

## 2017-10-22 MED ORDER — SODIUM CHLORIDE 0.9 % IV SOLN
2.0000 g | Freq: Once | INTRAVENOUS | Status: AC
  Administered 2017-10-22: 2 g via INTRAVENOUS
  Filled 2017-10-22: qty 2

## 2017-10-22 MED ORDER — SODIUM CHLORIDE 0.9 % IV SOLN
INTRAVENOUS | Status: DC
  Administered 2017-10-22: 03:00:00 via INTRAVENOUS

## 2017-10-22 MED ORDER — LABETALOL HCL 5 MG/ML IV SOLN
INTRAVENOUS | Status: AC
  Filled 2017-10-22: qty 4

## 2017-10-22 MED ORDER — AZITHROMYCIN 250 MG PO TABS
1000.0000 mg | ORAL_TABLET | Freq: Once | ORAL | Status: AC
  Administered 2017-10-22: 1000 mg via ORAL
  Filled 2017-10-22: qty 4

## 2017-10-22 MED ORDER — LABETALOL HCL 5 MG/ML IV SOLN
20.0000 mg | INTRAVENOUS | Status: DC | PRN
  Administered 2017-10-22: 20 mg via INTRAVENOUS

## 2017-10-22 NOTE — MAU Provider Note (Signed)
Chief Complaint:  Rupture of Membranes   First Provider Initiated Contact with Patient 10/22/17 0152     HPI: Danton ClapChristina S Melka is a 42 y.o. G2P0101 at 8134w0dwho presents to maternity admissions reporting leaking fluid since.2345hrs.  Clear fluid.  Not feeling much as far as pain goes.  Just received two doses of betamethasone yesterday and day before for bleeding which occurred on 10/20/17. Cervix was closed She reports good fetal movement, denies vaginal bleeding, vaginal itching/burning, urinary symptoms, h/a, dizziness, n/v, diarrhea, constipation or fever/chills.  She denies headache, visual changes or RUQ abdominal pain.  History is remarkable for prior cesarean section.  Plans repeat C/S on 11/26/17  Vaginal Discharge  The patient's primary symptoms include vaginal discharge. The patient's pertinent negatives include no genital itching, genital lesions, genital odor, pelvic pain or vaginal bleeding. This is a new problem. The current episode started today. The problem occurs constantly. The problem has been unchanged. The patient is experiencing no pain. She is pregnant. Pertinent negatives include no constipation, diarrhea, fever or headaches. The vaginal discharge was clear and watery. There has been no bleeding. She has not been passing clots. She has not been passing tissue. Nothing aggravates the symptoms. She has tried nothing for the symptoms. Her past medical history is significant for an abdominal surgery (Cesarean).    RN Note PT  SAYS  SHE WAS HERE  YESTERDAY   FOR   B[facility]ASONE  .   [facility]    SAYS SROM  AT 2345PM.   Past Medical History: Past Medical History:  Diagnosis Date  . Hypertension   . Thalassemia, unspecified     Past obstetric history: OB History  Gravida Para Term Preterm AB Living  2 1   1   1   SAB TAB Ectopic Multiple Live Births        0 1    # Outcome Date GA Lbr Len/2nd Weight Sex Delivery Anes PTL Lv  2 Current           1 Preterm  08/17/15 941w2d  3 lb 14.4 oz (1.77 kg) M CS-LTranv Spinal  LIV    Past Surgical History: Past Surgical History:  Procedure Laterality Date  . CESAREAN SECTION N/A 08/17/2015   Procedure: CESAREAN SECTION;  Surgeon: Philip AspenSidney Callahan, DO;  Location: [facility];  Service: Obstetrics;  Laterality: N/A;    Family History: History reviewed. No pertinent family history.  Social History: Social History   Tobacco Use  . Smoking status: Former Smoker    Last attempt to quit: 03/01/2014    Years since quitting: 3.6  . Smokeless tobacco: Never Used  Substance Use Topics  . Alcohol use: No  . Drug use: No    Allergies: No Known Allergies  Meds:  Medications Prior to Admission  Medication Sig Dispense Refill Last Dose  . aspirin 81 MG tablet Take 81 mg by mouth at bedtime.    10/21/2017 at Unknown time  . ferrous sulfate 325 (65 FE) MG tablet Take 325 mg by mouth at bedtime.    10/21/2017 at Unknown time  . hydroxyprogesterone caproate (MAKENA) 250 mg/mL OIL injection Inject 250 mg into the muscle once.   Past Week at Unknown time  . labetalol (NORMODYNE) 100 MG tablet Take 50 mg by mouth 2 (two) times daily.   10/21/2017 at Unknown time  . loratadine (CLARITIN) 10 MG tablet Take 10 mg by mouth daily as needed for allergies.   10/21/2017 at Unknown time  .  Prenatal Vit-Fe Fumarate-FA (MULTIVITAMIN-PRENATAL) 27-0.8 MG TABS tablet Take 1 tablet by mouth at bedtime.    10/21/2017 at Unknown time    I have reviewed patient's Past Medical Hx, Surgical Hx, Family Hx, Social Hx, medications and allergies.   ROS:  Review of Systems  Constitutional: Negative for fever.  Gastrointestinal: Negative for constipation and diarrhea.  Genitourinary: Positive for vaginal discharge. Negative for pelvic pain.  Neurological: Negative for headaches.   Other systems negative  Physical Exam   Patient Vitals for the past 24 hrs:  BP Temp Temp src Pulse Resp Height Weight  10/22/17 0108 (!) 142/96 99 F (37.2 C)  Oral (!) 131 20 4\' 11"  (1.499 m) 177 lb 12 oz (80.6 kg)   Constitutional: Well-developed, well-nourished female in no acute distress.  Cardiovascular: normal rate and rhythm Respiratory: normal effort, clear to auscultation bilaterally GI: Abd soft, non-tender, gravid appropriate for gestational age.   No rebound or guarding. MS: Extremities nontender, no edema, normal ROM Neurologic: Alert and oriented x 4.  GU: Neg CVAT.  PELVIC EXAM: Gross evidence of Preterm Premature Rupture of membranes.  + ferning   + pooling on bed  Cervical exam deferred Cephalic presentation by US done 10/20/17  FHT:  Baseline 140 , moderate variability, accelerations present, no decelerations Contractions:  Rare   Labs: Results for orders placed or performed during the hospital encounter of 10/22/17 (from the past 24 hour(s))  Fern Test     Status: None   Collection Time: 10/22/17  1:25 AM  Result Value Ref Range   POCT Fern Test Positive = ruptured amniotic membanes       Imaging:  [place] Mfm Ob Limited  Result Date: 10/20/2017 ----------------------------------------------------------------------  OBSTETRICS REPORT                      (Signed Final 10/20/2017 10:18 am) ---------------------------------------------------------------------- Patient Info  ID #:       [id number]                          D.O.B.:  11-16-1975 (42 yrs)  Name:       IDALIS HOELTING Truex               Visit Date: 10/20/2017 09:32 am ---------------------------------------------------------------------- Performed By  Performed By:     Eden Lathe BS      Ref. Address:     [address]                    [address]                                                             [address]                                                             [phone]  [place], [place]                                                             [address]  Attending:        Durwin Nora       Location:          [facility]                    MD  Referred By:      Waynard Reeds MD ---------------------------------------------------------------------- Orders   #  Description                                 Code   1  [place] MFM OB LIMITED                           [phone]  ----------------------------------------------------------------------   #  Ordered By               Order #        Accession #    Episode #   1  Donette Larry          [id number]      [id number]     [id number]  ---------------------------------------------------------------------- Indications   [redacted] weeks gestation of pregnancy                Z3A.34   Advanced maternal age multigravida 77+,        O51.523   third trimester (balanced Robertsonian)   Hypertension - Chronic/Pre-existing            O10.019   History of cesarean delivery, currently        O34.219   pregnant   Poor obstetric history (prior PPROM at 25      O09.219   weeks, then decels at 33 weeks)   Placenta previa specified as without           O44.03   hemorrhage, third trimester  ---------------------------------------------------------------------- OB History  Blood Type:            Height:  4'11"  Weight (lb):  162       BMI:  32.72  Gravidity:    2         Prem:   1  Living:       1 ---------------------------------------------------------------------- Fetal Evaluation  Num Of Fetuses:     1  Fetal Heart         133  Rate(bpm):  Cardiac Activity:   Observed  Presentation:       Cephalic  Placenta:           Anterior, above cervical os  P. Cord Insertion:  Previously Visualized  Amniotic Fluid  AFI FV:      Subjectively within normal limits  AFI Sum(cm)     %Tile       Largest Pocket(cm)  11.8            32          4.91  RUQ(cm)       RLQ(cm)       LUQ(cm)        LLQ(cm)  2.06  3.68          4.91           1.15  Comment:    No placental abruption or previa identified. ---------------------------------------------------------------------- Gestational Age  Best:          33w 5d      Det. ByMurtis Sink Transfer          EDD:   12/03/17 ---------------------------------------------------------------------- Cervix Uterus Adnexa  Cervix  Not visualized (advanced GA >29wks)  Uterus  No abnormality visualized.  Left Ovary  Not visualized.  Right Ovary  Not visualized.  Cul De Sac:   No free fluid seen.  Adnexa:       No abnormality visualized. ---------------------------------------------------------------------- Impression  Single living intrauterine pregnancy at 33w 5d (remote read of  limited ultrasound evaluation as requested)  Cephalic presentation.  Placenta Anterior, above cervical os.  Normal amniotic fluid volume.   no sonographic evidence of abruption or previa ---------------------------------------------------------------------- Recommendations  Given HTN, continue antenatal testing and serial growth.  Otherwise, follow up and management as clinically indicated  in the interim. ----------------------------------------------------------------------               Durwin Nora, MD Electronically Signed Final Report   10/20/2017 10:18 am ----------------------------------------------------------------------   MAU Course/MDM: I have ordered labs and reviewed results. Labs to be done at [facility], since they will need to get a Type/screen there anyway.  NST reviewed, reactive IV started with NS .   Latency antibiotics, Azithromycin and Ampicillin Consult Dr Chestine Spore with presentation, exam findings and test results.  Transfer to [facility] due to NICU closure here  Assessment: 1. Balanced chromosomal translocation   2.    Single IUP at [redacted]w[redacted]d 3.    Preterm Premature Rupture of Membranes  Plan: Transfer to [facility], Dr Marjo Bicker accepting Latency antibiotics Planned repeat Cesarean section MD to follow  Wynelle Bourgeois CNM, MSN Certified Nurse-Midwife 10/22/2017 1:53 AM

## 2017-10-22 NOTE — MAU Note (Signed)
PT  SAYS  SHE WAS HERE  YESTERDAY   FOR   BETAMETHASONE  .   [facility]    SAYS SROM  AT 2345PM.

## 2017-10-23 MED ORDER — SODIUM CHLORIDE 0.9 % IV SOLN
250.00 | INTRAVENOUS | Status: DC
Start: ? — End: 2017-10-23

## 2017-10-23 MED ORDER — PROPOFOL 200 MG/20ML IV EMUL
.10 | INTRAVENOUS | Status: DC
Start: ? — End: 2017-10-23

## 2017-10-23 MED ORDER — ALUM & MAG HYDROXIDE-SIMETH 200-200-20 MG/5ML PO SUSP
30.00 | ORAL | Status: DC
Start: ? — End: 2017-10-23

## 2017-10-23 MED ORDER — GENERIC EXTERNAL MEDICATION
Status: DC
Start: ? — End: 2017-10-23

## 2017-10-23 MED ORDER — MILRINONE LACTATE IN DEXTROSE 20-5 MG/100ML-% IV SOLN
0.30 | INTRAVENOUS | Status: DC
Start: ? — End: 2017-10-23

## 2017-10-23 MED ORDER — HEPARIN SOD (PORCINE) IN D5W 100 UNIT/ML IV SOLN
6.20 | INTRAVENOUS | Status: DC
Start: ? — End: 2017-10-23

## 2017-10-23 MED ORDER — GENERIC EXTERNAL MEDICATION
1.00 | Status: DC
Start: ? — End: 2017-10-23

## 2017-10-23 MED ORDER — SIMETHICONE 80 MG PO CHEW
80.00 | CHEWABLE_TABLET | ORAL | Status: DC
Start: ? — End: 2017-10-23

## 2017-10-23 MED ORDER — BENZOCAINE-MENTHOL 20-0.5 % EX AERO
INHALATION_SPRAY | CUTANEOUS | Status: DC
Start: ? — End: 2017-10-23

## 2017-10-23 MED ORDER — MAGNESIUM HYDROXIDE 400 MG/5ML PO SUSP
30.00 | ORAL | Status: DC
Start: ? — End: 2017-10-23

## 2017-10-23 MED ORDER — MORPHINE SULFATE (PF) 4 MG/ML IV SOLN
4.00 | INTRAVENOUS | Status: DC
Start: ? — End: 2017-10-23

## 2017-10-23 MED ORDER — GENERIC EXTERNAL MEDICATION
3.38 | Status: DC
Start: 2017-10-24 — End: 2017-10-23

## 2017-10-23 MED ORDER — BISACODYL 10 MG RE SUPP
10.00 | RECTAL | Status: DC
Start: ? — End: 2017-10-23

## 2017-10-23 MED ORDER — GENERIC EXTERNAL MEDICATION
0.03 | Status: DC
Start: ? — End: 2017-10-23

## 2017-10-23 MED ORDER — LANOLIN EX OINT
TOPICAL_OINTMENT | CUTANEOUS | Status: DC
Start: ? — End: 2017-10-23

## 2017-10-23 MED ORDER — SODIUM CHLORIDE 0.9 % IV SOLN
10.00 | INTRAVENOUS | Status: DC
Start: ? — End: 2017-10-23

## 2017-10-23 MED ORDER — MEASLES, MUMPS & RUBELLA VAC ~~LOC~~ INJ
0.50 | INJECTION | SUBCUTANEOUS | Status: DC
Start: ? — End: 2017-10-23

## 2017-10-23 MED ORDER — GUAIFENESIN 100 MG/5ML PO LIQD
200.00 | ORAL | Status: DC
Start: ? — End: 2017-10-23

## 2017-10-23 MED ORDER — FIRST-LANSOPRAZOLE 3 MG/ML PO SUSP
30.00 | ORAL | Status: DC
Start: 2017-10-24 — End: 2017-10-23

## 2017-10-23 MED ORDER — FUROSEMIDE 10 MG/ML IJ SOLN
40.00 | INTRAMUSCULAR | Status: DC
Start: 2017-10-24 — End: 2017-10-23

## 2017-10-23 MED ORDER — FENTANYL CITRATE-NACL 2.5-0.9 MG/250ML-% IV SOLN
12.50 | INTRAVENOUS | Status: DC
Start: ? — End: 2017-10-23

## 2017-10-23 MED ORDER — MORPHINE SULFATE (PF) 4 MG/ML IV SOLN
10.00 | INTRAVENOUS | Status: DC
Start: ? — End: 2017-10-23

## 2017-10-23 MED ORDER — OXYCODONE HCL 10 MG PO TABS
10.00 | ORAL_TABLET | ORAL | Status: DC
Start: ? — End: 2017-10-23

## 2017-10-23 MED ORDER — BENZOCAINE-MENTHOL 15-3.6 MG MT LOZG
1.00 | LOZENGE | OROMUCOSAL | Status: DC
Start: ? — End: 2017-10-23

## 2017-10-23 MED ORDER — SALINE NASAL SPRAY 0.65 % NA SOLN
2.00 | NASAL | Status: DC
Start: ? — End: 2017-10-23

## 2017-10-23 MED ORDER — HYDROMORPHONE HCL 1 MG/ML IJ SOLN
1.00 | INTRAMUSCULAR | Status: DC
Start: ? — End: 2017-10-23

## 2017-10-23 MED ORDER — TETANUS-DIPHTH-ACELL PERTUSSIS 5-2-15.5 LF-MCG/0.5 IM SUSP
.50 | INTRAMUSCULAR | Status: DC
Start: ? — End: 2017-10-23

## 2017-10-23 MED ORDER — GENERIC EXTERNAL MEDICATION
750.00 | Status: DC
Start: 2017-10-24 — End: 2017-10-23

## 2017-10-25 MED ORDER — PROPOFOL 100 MG/10ML IV EMUL
.00 | INTRAVENOUS | Status: DC
Start: ? — End: 2017-10-25

## 2017-10-25 MED ORDER — GENERIC EXTERNAL MEDICATION
20.00 | Status: DC
Start: ? — End: 2017-10-25

## 2017-10-25 MED ORDER — GENERIC EXTERNAL MEDICATION
1.00 | Status: DC
Start: ? — End: 2017-10-25

## 2017-10-25 MED ORDER — SODIUM CHLORIDE 0.9 % IV SOLN
10.00 | INTRAVENOUS | Status: DC
Start: ? — End: 2017-10-25

## 2017-10-25 MED ORDER — SODIUM CHLORIDE 0.9 % IV SOLN
250.00 | INTRAVENOUS | Status: DC
Start: ? — End: 2017-10-25

## 2017-10-25 MED ORDER — GENERIC EXTERNAL MEDICATION
Status: DC
Start: ? — End: 2017-10-25

## 2017-10-25 MED ORDER — POLYVINYL ALCOHOL 1.4 % OP SOLN
2.00 | OPHTHALMIC | Status: DC
Start: ? — End: 2017-10-25

## 2017-10-25 MED ORDER — NOREPINEPHRINE BITARTRATE IV
3.00 | INTRAVENOUS | Status: DC
Start: ? — End: 2017-10-25

## 2017-10-25 MED ORDER — HEPARIN SOD (PORCINE) IN D5W 100 UNIT/ML IV SOLN
12.00 | INTRAVENOUS | Status: DC
Start: ? — End: 2017-10-25

## 2017-10-25 MED ORDER — CHLORHEXIDINE GLUCONATE 0.12 % MT SOLN
15.00 | OROMUCOSAL | Status: DC
Start: 2017-10-27 — End: 2017-10-25

## 2017-10-25 MED ORDER — GENERIC EXTERNAL MEDICATION
1.00 | Status: DC
Start: 2017-10-27 — End: 2017-10-25

## 2017-10-25 MED ORDER — GENERIC EXTERNAL MEDICATION
1.00 | Status: DC
Start: 2017-10-25 — End: 2017-10-25

## 2017-10-25 MED ORDER — GENERIC EXTERNAL MEDICATION
37.50 | Status: DC
Start: ? — End: 2017-10-25

## 2017-10-25 MED ORDER — ACETAMINOPHEN 500 MG PO TABS
1000.00 | ORAL_TABLET | ORAL | Status: DC
Start: ? — End: 2017-10-25

## 2017-10-25 MED ORDER — POLYVINYL ALCOHOL 1.4 % OP SOLN
2.00 | OPHTHALMIC | Status: DC
Start: 2017-10-25 — End: 2017-10-25

## 2017-10-25 MED ORDER — DEXTROSE 10 % IV SOLN
125.00 | INTRAVENOUS | Status: DC
Start: ? — End: 2017-10-25

## 2017-10-25 MED ORDER — PANTOPRAZOLE SODIUM 40 MG IV SOLR
40.00 | INTRAVENOUS | Status: DC
Start: 2017-10-28 — End: 2017-10-25

## 2017-10-25 MED ORDER — CHLORHEXIDINE GLUCONATE 0.12 % MT SOLN
15.00 | OROMUCOSAL | Status: DC
Start: ? — End: 2017-10-25

## 2017-10-25 MED ORDER — MILRINONE LACTATE IN DEXTROSE 40-5 MG/200ML-% IV SOLN
0.13 | INTRAVENOUS | Status: DC
Start: ? — End: 2017-10-25

## 2017-10-25 MED ORDER — DOBUTAMINE IN D5W 1-5 MG/ML-% IV SOLN
3.00 | INTRAVENOUS | Status: DC
Start: ? — End: 2017-10-25

## 2017-10-25 MED ORDER — NOREPINEPHRINE BITARTRATE IV
.00 | INTRAVENOUS | Status: DC
Start: ? — End: 2017-10-25

## 2017-10-25 MED ORDER — OXIDIZED CELLULOSE EX PADS
1.00 | MEDICATED_PAD | CUTANEOUS | Status: DC
Start: ? — End: 2017-10-25

## 2017-10-25 MED ORDER — FENTANYL CITRATE (PF) 2500 MCG/50ML IJ SOLN
.00 | INTRAMUSCULAR | Status: DC
Start: ? — End: 2017-10-25

## 2017-10-25 MED ORDER — MUPIROCIN 2 % EX OINT
TOPICAL_OINTMENT | CUTANEOUS | Status: DC
Start: 2017-10-27 — End: 2017-10-25

## 2017-10-25 MED ORDER — GENERIC EXTERNAL MEDICATION
3.38 g | Status: DC
Start: 2017-10-27 — End: 2017-10-25

## 2017-10-25 MED ORDER — PHENYLEPHRINE HCL-NACL 20-0.9 MG/250ML-% IV SOLN
150.00 | INTRAVENOUS | Status: DC
Start: ? — End: 2017-10-25

## 2017-10-27 MED ORDER — GENERIC EXTERNAL MEDICATION
Status: DC
Start: 2017-10-27 — End: 2017-10-27

## 2017-10-27 MED ORDER — FOLIC ACID 1 MG PO TABS
1.00 | ORAL_TABLET | ORAL | Status: DC
Start: 2017-11-02 — End: 2017-10-27

## 2017-10-27 MED ORDER — OXYCODONE HCL 5 MG/5ML PO SOLN
5.00 | ORAL | Status: DC
Start: 2017-10-27 — End: 2017-10-27

## 2017-10-27 MED ORDER — GENERIC EXTERNAL MEDICATION
Status: DC
Start: ? — End: 2017-10-27

## 2017-10-27 MED ORDER — GENERIC EXTERNAL MEDICATION
0.20 | Status: DC
Start: ? — End: 2017-10-27

## 2017-11-01 MED ORDER — OXYCODONE HCL 5 MG/5ML PO SOLN
5.00 | ORAL | Status: DC
Start: ? — End: 2017-11-01

## 2017-11-01 MED ORDER — GENERIC EXTERNAL MEDICATION
1.00 | Status: DC
Start: 2017-11-02 — End: 2017-11-01

## 2017-11-01 MED ORDER — ACETAMINOPHEN 650 MG RE SUPP
650.00 | RECTAL | Status: DC
Start: ? — End: 2017-11-01

## 2017-11-01 MED ORDER — MILRINONE LACTATE IN DEXTROSE 40-5 MG/200ML-% IV SOLN
0.13 | INTRAVENOUS | Status: DC
Start: ? — End: 2017-11-01

## 2017-11-01 MED ORDER — CAPTOPRIL 12.5 MG PO TABS
12.50 | ORAL_TABLET | ORAL | Status: DC
Start: 2017-11-02 — End: 2017-11-01

## 2017-11-01 MED ORDER — HEPARIN SODIUM (PORCINE) 5000 UNIT/ML IJ SOLN
5000.00 | INTRAMUSCULAR | Status: DC
Start: 2017-11-01 — End: 2017-11-01

## 2017-11-01 MED ORDER — GENERIC EXTERNAL MEDICATION
0.00 | Status: DC
Start: ? — End: 2017-11-01

## 2017-11-01 MED ORDER — POLYSACCHARIDE IRON COMPLEX 150 MG PO CAPS
150.00 | ORAL_CAPSULE | ORAL | Status: DC
Start: 2017-11-02 — End: 2017-11-01

## 2017-11-01 MED ORDER — DIGOXIN 125 MCG PO TABS
0.13 | ORAL_TABLET | ORAL | Status: DC
Start: 2017-11-02 — End: 2017-11-01

## 2017-11-01 MED ORDER — GENERIC EXTERNAL MEDICATION
1.00 g | Status: DC
Start: 2017-11-01 — End: 2017-11-01

## 2017-11-01 MED ORDER — CALCIUM CARBONATE 1250 (500 CA) MG PO TABS
1250.00 | ORAL_TABLET | ORAL | Status: DC
Start: 2017-11-02 — End: 2017-11-01

## 2017-11-25 ENCOUNTER — Encounter (HOSPITAL_COMMUNITY)
Admission: RE | Admit: 2017-11-25 | Discharge: 2017-11-25 | Disposition: A | Discharge status: Home / Self Care | Payer: BLUE CROSS/BLUE SHIELD | Source: Ambulatory Visit

## 2017-11-26 ENCOUNTER — Encounter (HOSPITAL_COMMUNITY): Payer: Self-pay

## 2017-11-26 ENCOUNTER — Inpatient Hospital Stay (HOSPITAL_COMMUNITY): Admit: 2017-11-26 | Payer: BLUE CROSS/BLUE SHIELD | Admitting: Obstetrics and Gynecology

## 2017-11-26 SURGERY — Surgical Case
Anesthesia: Regional | Laterality: Bilateral

## 2017-12-06 ENCOUNTER — Telehealth (HOSPITAL_COMMUNITY): Payer: Self-pay

## 2017-12-06 NOTE — Telephone Encounter (Signed)
Patient returned phone call in regards to Cardiac Rehab - Scheduled orientation on 12/09/2017 at 8:15am. Patient will attend the 2:45pm exc class. Patient will complete packet the day of orientation.

## 2017-12-06 NOTE — Telephone Encounter (Signed)
Patients insurance is active and benefits verified through [employer] - No co-pay, deductible amount of $3,800/$3,800 has been met, out of pocket amount of $6,650/$6,650 has been met, 20% co-insurance, and no pre-authorization is required. Passport/Reference [phone]

## 2017-12-06 NOTE — Telephone Encounter (Signed)
Attempted to call patient in regards to Cardiac Rehab - lm on vm °

## 2017-12-09 ENCOUNTER — Encounter (HOSPITAL_COMMUNITY)
Admission: RE | Admit: 2017-12-09 | Discharge: 2017-12-09 | Disposition: A | Discharge status: Home / Self Care | Payer: BLUE CROSS/BLUE SHIELD | Source: Ambulatory Visit | Attending: Cardiology | Admitting: Cardiology

## 2017-12-09 ENCOUNTER — Encounter (HOSPITAL_COMMUNITY): Payer: Self-pay

## 2017-12-09 VITALS — Ht 59.0 in | Wt 143.1 lb

## 2017-12-09 DIAGNOSIS — I110 Hypertensive heart disease with heart failure: Secondary | ICD-10-CM | POA: Diagnosis not present

## 2017-12-09 DIAGNOSIS — I5022 Chronic systolic (congestive) heart failure: Secondary | ICD-10-CM | POA: Diagnosis present

## 2017-12-09 DIAGNOSIS — D569 Thalassemia, unspecified: Secondary | ICD-10-CM | POA: Insufficient documentation

## 2017-12-09 DIAGNOSIS — Z79899 Other long term (current) drug therapy: Secondary | ICD-10-CM | POA: Diagnosis not present

## 2017-12-09 DIAGNOSIS — Z87891 Personal history of nicotine dependence: Secondary | ICD-10-CM | POA: Diagnosis not present

## 2017-12-09 NOTE — Progress Notes (Signed)
Cardiac Individual Treatment Plan  Patient Details  Name: Melinda Allen MRN: [id number] Date of Birth: Jan 04, 1976 Referring Provider:     CARDIAC REHAB PHASE II ORIENTATION from 12/09/2017 in [facility] CARDIAC REHAB  Referring Provider  Quintella Reichert MD       Initial Encounter Date:    CARDIAC REHAB PHASE II ORIENTATION from 12/09/2017 in [facility] CARDIAC REHAB  Date  12/09/17  Referring Provider  Quintella Reichert MD       Visit Diagnosis: Chronic systolic congestive heart failure (HCC)  Patient's Home Medications on Admission:  Current Outpatient Medications:  .  digoxin (LANOXIN) 0.125 MG tablet, Take 0.125 mg by mouth daily., Disp: , Rfl:  .  ferrous sulfate 325 (65 FE) MG tablet, Take 325 mg by mouth at bedtime. , Disp: , Rfl:  .  Fluticasone Furoate 50 MCG/ACT AEPB, Inhale 1 spray into the lungs daily as needed., Disp: , Rfl:  .  furosemide (LASIX) 20 MG tablet, Take 20 mg by mouth., Disp: , Rfl:  .  loratadine (CLARITIN) 10 MG tablet, Take 10 mg by mouth daily as needed for allergies., Disp: , Rfl:  .  Melatonin 1 MG CAPS, Take 1 capsule by mouth at bedtime as needed., Disp: , Rfl:  .  metoprolol succinate (TOPROL-XL) 25 MG 24 hr tablet, Take 25 mg by mouth daily., Disp: , Rfl:  .  Prenatal Vit-Fe Fumarate-FA (MULTIVITAMIN-PRENATAL) 27-0.8 MG TABS tablet, Take 1 tablet by mouth at bedtime. , Disp: , Rfl:  .  spironolactone (ALDACTONE) 25 MG tablet, Take 25 mg by mouth daily., Disp: , Rfl:   Past Medical History: Past Medical History:  Diagnosis Date  . Hypertension   . Thalassemia, unspecified     Tobacco Use: Social History   Tobacco Use  Smoking Status Former Smoker  . Last attempt to quit: 03/01/2014  . Years since quitting: 3.7  Smokeless Tobacco Never Used    Labs: Recent Review Flowsheet Data    There is no flowsheet data to display.      Capillary Blood Glucose: No results found for: GLUCAP   Exercise  Target Goals: Date: 12/09/17  Exercise Program Goal: Individual exercise prescription set using results from initial 6 min walk test and THRR while considering  patient's activity barriers and safety.   Exercise Prescription Goal: Initial exercise prescription builds to 30-45 minutes a day of aerobic activity, 2-3 days per week.  Home exercise guidelines will be given to patient during program as part of exercise prescription that the participant will acknowledge.  Activity Barriers & Risk Stratification: Activity Barriers & Cardiac Risk Stratification - 12/09/17 0854      Activity Barriers & Cardiac Risk Stratification   Activity Barriers  None    Cardiac Risk Stratification  High       6 Minute Walk: 6 Minute Walk    Row Name 12/09/17 1059         6 Minute Walk   Phase  Initial     Distance  1552 feet     Walk Time  6 minutes     # of Rest Breaks  0     MPH  2.94     METS  4.63     RPE  9     Perceived Dyspnea   0     VO2 Peak  16.19     Symptoms  No     Resting HR  82 bpm  Resting BP  102/40     Resting Oxygen Saturation   99 %     Exercise Oxygen Saturation  during 6 min walk  100 %     Max Ex. HR  110 bpm     Max Ex. BP  104/72     2 Minute Post BP  94/64        Oxygen Initial Assessment:   Oxygen Re-Evaluation:   Oxygen Discharge (Final Oxygen Re-Evaluation):   Initial Exercise Prescription: Initial Exercise Prescription - 12/09/17 1100      Date of Initial Exercise RX and Referring Provider   Date  12/09/17    Referring Provider  Quintella Reichert MD       Recumbant Bike   Level  2 [employer]    Watts  45    Minutes  10    METs  4.18      NuStep   Level  2    SPM  75    Minutes  10    METs  3.5      Track   Laps  13    Minutes  10    METs  3.3      Prescription Details   Frequency (times per week)  3x    Duration  Progress to 30 minutes of continuous aerobic without signs/symptoms of physical distress      Intensity   THRR 40-80%  of Max Heartrate  72-144    Ratings of Perceived Exertion  11-13    Perceived Dyspnea  0-4      Progression   Progression  Continue progressive overload as per policy without signs/symptoms or physical distress.      Resistance Training   Training Prescription  Yes    Weight  3lbs    Reps  10-15       Perform Capillary Blood Glucose checks as needed.  Exercise Prescription Changes:   Exercise Comments:   Exercise Goals and Review: Exercise Goals    Row Name 12/09/17 1100             Exercise Goals   Increase Physical Activity  Yes       Intervention  Provide advice, education, support and counseling about physical activity/exercise needs.;Develop an individualized exercise prescription for aerobic and resistive training based on initial evaluation findings, risk stratification, comorbidities and participant's personal goals.       Expected Outcomes  Short Term: Attend rehab on a regular basis to increase amount of physical activity.;Long Term: Add in home exercise to make exercise part of routine and to increase amount of physical activity.;Long Term: Exercising regularly at least 3-5 days a week.       Increase Strength and Stamina  Yes       Intervention  Provide advice, education, support and counseling about physical activity/exercise needs.;Develop an individualized exercise prescription for aerobic and resistive training based on initial evaluation findings, risk stratification, comorbidities and participant's personal goals.       Expected Outcomes  Short Term: Increase workloads from initial exercise prescription for resistance, speed, and METs.;Short Term: Perform resistance training exercises routinely during rehab and add in resistance training at home;Long Term: Improve cardiorespiratory fitness, muscular endurance and strength as measured by increased METs and functional capacity ( )       Able to understand and use rate of perceived exertion (RPE) scale  Yes        Intervention  Provide education and explanation on how to use  RPE scale       Expected Outcomes  Short Term: Able to use RPE daily in rehab to express subjective intensity level;Long Term:  Able to use RPE to guide intensity level when exercising independently       Knowledge and understanding of Target Heart Rate Range (THRR)  Yes       Intervention  Provide education and explanation of THRR including how the numbers were predicted and where they are located for reference       Expected Outcomes  Short Term: Able to state/look up THRR;Long Term: Able to use THRR to govern intensity when exercising independently;Short Term: Able to use daily as guideline for intensity in rehab       Able to check pulse independently  Yes       Intervention  Provide education and demonstration on how to check pulse in carotid and radial arteries.;Review the importance of being able to check your own pulse for safety during independent exercise       Expected Outcomes  Short Term: Able to explain why pulse checking is important during independent exercise;Long Term: Able to check pulse independently and accurately       Understanding of Exercise Prescription  Yes       Intervention  Provide education, explanation, and written materials on patient's individual exercise prescription       Expected Outcomes  Short Term: Able to explain program exercise prescription;Long Term: Able to explain home exercise prescription to exercise independently          Exercise Goals Re-Evaluation :    Discharge Exercise Prescription (Final Exercise Prescription Changes):   Nutrition:  Target Goals: Understanding of nutrition guidelines, daily intake of sodium 1500mg , cholesterol 200mg , calories 30% from fat and 7% or less from saturated fats, daily to have 5 or more servings of fruits and vegetables.  Biometrics: Pre Biometrics - 12/09/17 1101      Pre Biometrics   Height  4\' 11"  (1.499 m)    Weight  143 lb 1.3 oz (64.9  kg)    Waist Circumference  36.5 inches    Hip Circumference  39 inches    Waist to Hip Ratio  0.94 %    BMI (Calculated)  28.88    Triceps Skinfold  28 mm    % Body Fat  39 %    Grip Strength  26 kg    Flexibility  13 in    Single Leg Stand  30 seconds        Nutrition Therapy Plan and Nutrition Goals:   Nutrition Assessments:   Nutrition Goals Re-Evaluation:   Nutrition Goals Re-Evaluation:   Nutrition Goals Discharge (Final Nutrition Goals Re-Evaluation):   Psychosocial: Target Goals: Acknowledge presence or absence of significant depression and/or stress, maximize coping skills, provide positive support system. Participant is able to verbalize types and ability to use techniques and skills needed for reducing stress and depression.  Initial Review & Psychosocial Screening: Initial Psych Review & Screening - 12/09/17 0948      Initial Review   Current issues with  Current Stress Concerns    Source of Stress Concerns  Unable to participate in former interests or hobbies    Comments  new baby with unexpected heart failure postpartum complications       Family Dynamics   Good Support System?  Yes spouse, parents, in laws       Barriers   Psychosocial barriers to participate in program  There are  no identifiable barriers or psychosocial needs.      Screening Interventions   Interventions  Encouraged to exercise       Quality of Life Scores: Quality of Life - 12/09/17 1103      Quality of Life Scores   Health/Function Pre  28 %    Socioeconomic Pre  30 %    Psych/Spiritual Pre  30 %    Family Pre  30 %    GLOBAL Pre  29.14 %      Scores of 19 and below usually indicate a poorer quality of life in these areas.  A difference of  2-3 points is a clinically meaningful difference.  A difference of 2-3 points in the total score of the Quality of Life Index has been associated with significant improvement in overall quality of life, self-image, physical symptoms,  and general health in studies assessing change in quality of life.  PHQ-9: Recent Review Flowsheet Data    There is no flowsheet data to display.     Interpretation of Total Score  Total Score Depression Severity:  1-4 = Minimal depression, 5-9 = Mild depression, 10-14 = Moderate depression, 15-19 = Moderately severe depression, 20-27 = Severe depression   Psychosocial Evaluation and Intervention:   Psychosocial Re-Evaluation:   Psychosocial Discharge (Final Psychosocial Re-Evaluation):   Vocational Rehabilitation: Provide vocational rehab assistance to qualifying candidates.   Vocational Rehab Evaluation & Intervention: Vocational Rehab - 12/09/17 0947      Initial Vocational Rehab Evaluation & Intervention   Assessment shows need for Vocational Rehabilitation  No       Education: Education Goals: Education classes will be provided on a weekly basis, covering required topics. Participant will state understanding/return demonstration of topics presented.  Learning Barriers/Preferences: Learning Barriers/Preferences - 12/09/17 1057      Learning Barriers/Preferences   Learning Barriers  None    Learning Preferences  Written Material       Education Topics: Count Your Pulse:  -Group instruction provided by verbal instruction, demonstration, patient participation and written materials to support subject.  Instructors address importance of being able to find your pulse and how to count your pulse when at home without a heart monitor.  Patients get hands on experience counting their pulse with staff help and individually.   Heart Attack, Angina, and Risk Factor Modification:  -Group instruction provided by verbal instruction, video, and written materials to support subject.  Instructors address signs and symptoms of angina and heart attacks.    Also discuss risk factors for heart disease and how to make changes to improve heart health risk factors.   Functional Fitness:   -Group instruction provided by verbal instruction, demonstration, patient participation, and written materials to support subject.  Instructors address safety measures for doing things around the house.  Discuss how to get up and down off the floor, how to pick things up properly, how to safely get out of a chair without assistance, and balance training.   Meditation and Mindfulness:  -Group instruction provided by verbal instruction, patient participation, and written materials to support subject.  Instructor addresses importance of mindfulness and meditation practice to help reduce stress and improve awareness.  Instructor also leads participants through a meditation exercise.    Stretching for Flexibility and Mobility:  -Group instruction provided by verbal instruction, patient participation, and written materials to support subject.  Instructors lead participants through series of stretches that are designed to increase flexibility thus improving mobility.  These stretches are additional  exercise for major muscle groups that are typically performed during regular warm up and cool down.   Hands Only CPR:  -Group verbal, video, and participation provides a basic overview of AHA guidelines for community CPR. Role-play of emergencies allow participants the opportunity to practice calling for help and chest compression technique with discussion of AED use.   Hypertension: -Group verbal and written instruction that provides a basic overview of hypertension including the most recent diagnostic guidelines, risk factor reduction with self-care instructions and medication management.    Nutrition I class: Heart Healthy Eating:  -Group instruction provided by PowerPoint slides, verbal discussion, and written materials to support subject matter. The instructor gives an explanation and review of the Therapeutic Lifestyle Changes diet recommendations, which includes a discussion on lipid goals, dietary  fat, sodium, fiber, plant stanol/sterol esters, sugar, and the components of a well-balanced, healthy diet.   Nutrition II class: Lifestyle Skills:  -Group instruction provided by PowerPoint slides, verbal discussion, and written materials to support subject matter. The instructor gives an explanation and review of label reading, grocery shopping for heart health, heart healthy recipe modifications, and ways to make healthier choices when eating out.   Diabetes Question & Answer:  -Group instruction provided by PowerPoint slides, verbal discussion, and written materials to support subject matter. The instructor gives an explanation and review of diabetes co-morbidities, pre- and post-prandial blood glucose goals, pre-exercise blood glucose goals, signs, symptoms, and treatment of hypoglycemia and hyperglycemia, and foot care basics.   Diabetes Blitz:  -Group instruction provided by PowerPoint slides, verbal discussion, and written materials to support subject matter. The instructor gives an explanation and review of the physiology behind type 1 and type 2 diabetes, diabetes medications and rational behind using different medications, pre- and post-prandial blood glucose recommendations and Hemoglobin A1c goals, diabetes diet, and exercise including blood glucose guidelines for exercising safely.    Portion Distortion:  -Group instruction provided by PowerPoint slides, verbal discussion, written materials, and food models to support subject matter. The instructor gives an explanation of serving size versus portion size, changes in portions sizes over the last 20 years, and what consists of a serving from each food group.   Stress Management:  -Group instruction provided by verbal instruction, video, and written materials to support subject matter.  Instructors review role of stress in heart disease and how to cope with stress positively.     Exercising on Your Own:  -Group instruction provided  by verbal instruction, power point, and written materials to support subject.  Instructors discuss benefits of exercise, components of exercise, frequency and intensity of exercise, and end points for exercise.  Also discuss use of nitroglycerin and activating EMS.  Review options of places to exercise outside of rehab.  Review guidelines for sex with heart disease.   Cardiac Drugs I:  -Group instruction provided by verbal instruction and written materials to support subject.  Instructor reviews cardiac drug classes: antiplatelets, anticoagulants, beta blockers, and statins.  Instructor discusses reasons, side effects, and lifestyle considerations for each drug class.   Cardiac Drugs II:  -Group instruction provided by verbal instruction and written materials to support subject.  Instructor reviews cardiac drug classes: angiotensin converting enzyme inhibitors (ACE-I), angiotensin II receptor blockers (ARBs), nitrates, and calcium channel blockers.  Instructor discusses reasons, side effects, and lifestyle considerations for each drug class.   Anatomy and Physiology of the Circulatory System:  Group verbal and written instruction and models provide basic cardiac anatomy and physiology, with the  coronary electrical and arterial systems. Review of: AMI, Angina, Valve disease, Heart Failure, Peripheral Artery Disease, Cardiac Arrhythmia, Pacemakers, and the ICD.   Other Education:  -Group or individual verbal, written, or video instructions that support the educational goals of the cardiac rehab program.   Holiday Eating Survival Tips:  -Group instruction provided by PowerPoint slides, verbal discussion, and written materials to support subject matter. The instructor gives patients tips, tricks, and techniques to help them not only survive but enjoy the holidays despite the onslaught of food that accompanies the holidays.   Knowledge Questionnaire Score: Knowledge Questionnaire Score - 12/09/17  0947      Knowledge Questionnaire Score   Pre Score  22/24       Core Components/Risk Factors/Patient Goals at Admission: Personal Goals and Risk Factors at Admission - 12/09/17 1049      Core Components/Risk Factors/Patient Goals on Admission    Weight Management  Yes;Weight Loss    Intervention  Weight Management: Develop a combined nutrition and exercise program designed to reach desired caloric intake, while maintaining appropriate intake of nutrient and fiber, sodium and fats, and appropriate energy expenditure required for the weight goal.;Weight Management: Provide education and appropriate resources to help participant work on and attain dietary goals.;Weight Management/Obesity: Establish reasonable short term and long term weight goals.    Admit Weight  143 lb 1.3 oz (64.9 kg)    Goal Weight: Short Term  136 lb (61.7 kg)    Goal Weight: Long Term  130 lb (59 kg)    Expected Outcomes  Short Term: Continue to assess and modify interventions until short term weight is achieved;Long Term: Adherence to nutrition and physical activity/exercise program aimed toward attainment of established weight goal;Weight Loss: Understanding of general recommendations for a balanced deficit meal plan, which promotes 1-2 lb weight loss per week and includes a negative energy balance of [phone] kcal/d;Understanding recommendations for meals to include 15-35% energy as protein, 25-35% energy from fat, 35-60% energy from carbohydrates, less than 200mg  of dietary cholesterol, 20-35 gm of total fiber daily;Understanding of distribution of calorie intake throughout the day with the consumption of 4-5 meals/snacks    Heart Failure  Yes    Intervention  Provide a combined exercise and nutrition program that is supplemented with education, support and counseling about heart failure. Directed toward relieving symptoms such as shortness of breath, decreased exercise tolerance, and extremity edema.    Expected Outcomes   Improve functional capacity of life;Long term: Adoption of self-care skills and reduction of barriers for early signs and symptoms recognition and intervention leading to self-care maintenance.;Short term: Daily weights obtained and reported for increase. Utilizing diuretic protocols set by physician.;Short term: Attendance in program 2-3 days a week with increased exercise capacity. Reported lower sodium intake. Reported increased fruit and vegetable intake. Reports medication compliance.       Core Components/Risk Factors/Patient Goals Review:    Core Components/Risk Factors/Patient Goals at Discharge (Final Review):    ITP Comments: ITP Comments    Row Name 12/09/17 0819           ITP Comments  Dr. Armanda Magicraci Turner, Medical Director           Comments: Patient attended orientation from [phone] to [phone] to review rules and guidelines for program. Completed 6 minute walk test, Intitial ITP, and exercise prescription.  VSS. Telemetry-SR/ST.  Asymptomatic.

## 2017-12-09 NOTE — Progress Notes (Signed)
Melinda ClapChristina S Allen 42 y.o. female DOB: 01/15/1976 MRN: [id number]      Nutrition Note  1. Chronic systolic congestive heart failure (HCC)    Past Medical History:  Diagnosis Date  . Hypertension   . Thalassemia, unspecified    Meds reviewed.  HT: Ht Readings from Last 1 Encounters:  12/09/17 4\' 11"  (1.499 m)    WT: Wt Readings from Last 5 Encounters:  12/09/17 143 lb 1.3 oz (64.9 kg)  10/22/17 177 lb 12 oz (80.6 kg)  10/20/17 174 lb 1.9 oz (79 kg)  09/22/17 166 lb 4 oz (75.4 kg)  07/30/17 162 lb 8 oz (73.7 kg)     Body mass index is 28.9 kg/m.   Current tobacco use? No   Labs:  Lipid Panel  No results found for: CHOL, TRIG, HDL, CHOLHDL, VLDL, LDLCALC, LDLDIRECT  No results found for: HGBA1C CBG (last 3)  No results for input(s): GLUCAP in the last 72 hours.  Nutrition Note Spoke with pt. Nutrition plan and goals reviewed with pt. Pt is following a Heart Healthy diet. Pt wants to lose wt. Pt's pre-pregnancy wt was reportedly ~140 lb. Pt reports pre-delivery wt ~177 lb and pt's baby was born pre-maturely. Difficulty evaluating post-partum wt loss due to some wt loss fluid loss with CHF. Pt feels eating healthier food choices will result in wt loss. Pt is in the contemplative state of change. Wt loss tips reviewed. Pt reports she is following a 64 oz fluid restricted, 2 gm sodium diet. Pt is monitoring sodium intake closely. Pt expressed understanding of the information reviewed. Pt aware of nutrition education classes offered.  Nutrition Diagnosis ? Overweight related to excessive energy intake as evidenced by a Body mass index is 28.9 kg/m.  Nutrition Intervention ? Pt's individual nutrition plan and goals reviewed with pt.  Nutrition Goal(s):  ? Pt to identify food quantities necessary to achieve weight loss of 5-8 lb at graduation from cardiac rehab. Goal wt of 135 lb desired.   Plan:  Pt to attend nutrition classes ? Nutrition I ? Nutrition II ? Portion Distortion   Will provide client-centered nutrition education as part of interdisciplinary care.   Monitor and evaluate progress toward nutrition goal with team.  Mickle PlumbEdna Dade Rodin, M.Ed, RD, LDN, CDE 12/09/2017 11:05 AM

## 2017-12-13 ENCOUNTER — Encounter (HOSPITAL_COMMUNITY)
Admission: RE | Admit: 2017-12-13 | Discharge: 2017-12-13 | Disposition: A | Discharge status: Home / Self Care | Payer: BLUE CROSS/BLUE SHIELD | Source: Ambulatory Visit | Attending: Cardiology | Admitting: Cardiology

## 2017-12-13 ENCOUNTER — Encounter (HOSPITAL_COMMUNITY): Payer: Self-pay

## 2017-12-13 DIAGNOSIS — I110 Hypertensive heart disease with heart failure: Secondary | ICD-10-CM | POA: Diagnosis not present

## 2017-12-13 DIAGNOSIS — I5022 Chronic systolic (congestive) heart failure: Secondary | ICD-10-CM

## 2017-12-13 NOTE — Progress Notes (Signed)
Daily Session Note  Patient Details  Name: Melinda Allen MRN: [id number] Date of Birth: 08/17/1975 Referring Provider:   Flowsheet Row CARDIAC REHAB PHASE II ORIENTATION from 12/09/2017 in [place]  Referring Provider  Sueanne Margarita MD       Encounter Date: 12/13/2017  Check In: Session Check In - 12/13/17 1522    Check-In          Location  [facility]    Staff Present  Andi Hence, RN, BSN;Molly DiVincenzo, MS, ACSM RCEP, Exercise Physiologist;Tyara Nevels, MS,ACSM CEP, Exercise Physiologist;Olinty Celesta Aver, MS, ACSM CEP, Exercise Physiologist    Supervising physician immediately available to respond to emergencies  [facility] immediately available    Physician(s)  Dr. Broadus John    Medication changes reported      No    Fall or balance concerns reported     No    Tobacco Cessation  No Change    Warm-up and Cool-down  Performed as group-led instruction    Resistance Training Performed  Yes    VAD Patient?  No        Pain Assessment          Currently in Pain?  No/denies    Multiple Pain Sites  No           Capillary Blood Glucose: No results found for this or any previous visit (from the past 24 hour(s)).  Exercise Prescription Changes - 12/13/17 1454    Response to Exercise          Blood Pressure (Admit)  100/64    Blood Pressure (Exercise)  130/68    Blood Pressure (Exit)  104/70    Heart Rate (Admit)  97 bpm    Heart Rate (Exercise)  136 bpm    Heart Rate (Exit)  70 bpm    Rating of Perceived Exertion (Exercise)  11    Symptoms  none    Duration  Progress to 30 minutes of  aerobic without signs/symptoms of physical distress    Intensity  THRR unchanged        Progression          Progression  Continue to progress workloads to maintain intensity without signs/symptoms of physical distress.    Average METs  3.1        Resistance Training          Training Prescription  Yes    Weight  3lbs    Reps  10-15    Time  10 Minutes        Interval Training          Interval Training  No        Bike          Level  1    Minutes  10    METs  3.91        Recumbant Bike          Level  --    Watts  --    Minutes  --    METs  --        NuStep          Level  2    SPM  75    Minutes  10    METs  2.4        Track          Laps  12    Minutes  10    METs  3.09           Social History   Tobacco Use  Smoking Status Former Smoker  . Last attempt to quit: 03/01/2014  . Years since quitting: 3.7  Smokeless Tobacco Never Used    Goals Met:  Exercise tolerated well  Goals Unmet:  Not Applicable  Comments: Pt started cardiac rehab today.  Pt tolerated light exercise without difficulty. VSS, telemetry-sinus rhythm, , asymptomatic.  Medication list reconciled. Pt denies barriers to medicaiton compliance.  PSYCHOSOCIAL ASSESSMENT:  PHQ-0. Pt exhibits positive coping skills, hopeful outlook with supportive family. No psychosocial needs identified at this time, no psychosocial interventions necessary.    Pt enjoys spending time with her 40 yo and new born children.    Pt oriented to exercise equipment and routine.    Understanding verbalized. Andi Hence, RN, BSN Cardiac Pulmonary Rehab 12/13/17 5:25 PM    Dr. Fransico Him is Medical Director for Cardiac Rehab at [facility].

## 2017-12-15 ENCOUNTER — Encounter (HOSPITAL_COMMUNITY)
Admission: RE | Admit: 2017-12-15 | Discharge: 2017-12-15 | Disposition: A | Discharge status: Home / Self Care | Payer: BLUE CROSS/BLUE SHIELD | Source: Ambulatory Visit | Attending: Cardiology | Admitting: Cardiology

## 2017-12-15 DIAGNOSIS — I5022 Chronic systolic (congestive) heart failure: Secondary | ICD-10-CM

## 2017-12-15 DIAGNOSIS — I110 Hypertensive heart disease with heart failure: Secondary | ICD-10-CM | POA: Diagnosis not present

## 2017-12-17 ENCOUNTER — Encounter (HOSPITAL_COMMUNITY)
Admission: RE | Admit: 2017-12-17 | Discharge: 2017-12-17 | Disposition: A | Discharge status: Home / Self Care | Payer: BLUE CROSS/BLUE SHIELD | Source: Ambulatory Visit | Attending: Cardiology | Admitting: Cardiology

## 2017-12-17 DIAGNOSIS — I5022 Chronic systolic (congestive) heart failure: Secondary | ICD-10-CM

## 2017-12-17 DIAGNOSIS — I110 Hypertensive heart disease with heart failure: Secondary | ICD-10-CM | POA: Diagnosis not present

## 2017-12-20 ENCOUNTER — Encounter (HOSPITAL_COMMUNITY)
Admission: RE | Admit: 2017-12-20 | Discharge: 2017-12-20 | Disposition: A | Discharge status: Home / Self Care | Payer: BLUE CROSS/BLUE SHIELD | Source: Ambulatory Visit | Attending: Cardiology | Admitting: Cardiology

## 2017-12-20 DIAGNOSIS — I5022 Chronic systolic (congestive) heart failure: Secondary | ICD-10-CM

## 2017-12-20 DIAGNOSIS — I110 Hypertensive heart disease with heart failure: Secondary | ICD-10-CM | POA: Diagnosis not present

## 2017-12-20 NOTE — Progress Notes (Signed)
Reviewed home exercise guidelines with patient including endpoints, temperature precautions, target heart rate and rate of perceived exertion. Pt is walking 30-45 minutes, 2 days/week as her mode of home exercise. Pt voices understanding of instructions given. Artist Paislinty M Pilar Westergaard, MS, ACSM CEP

## 2017-12-20 NOTE — Progress Notes (Signed)
Danton ClapChristina S Allen 42 y.o. female Nutrition Note Spoke with pt. Nutrition Plan and Nutrition Survey goals reviewed with pt. Pt is following a Heart Healthy diet, has a fluid restriction of 64 oz, and is following a 2000 mg sodium diet. Pt wants to lose wt, but is more focused on following heart healthy sodium restricted diet at this time. Additional wt loss tips reviewed with patient. Per discussion, pt does not use canned/convenience foods often. Pt rarely adds salt to food. Pt eats out infrequently. If patient does eat out (1x a month) she will look up nutrition facts before going to restaurant. Pt expressed understanding of the information reviewed. Pt aware of nutrition education classes offered .  No results found for: HGBA1C  Wt Readings from Last 3 Encounters:  12/09/17 143 lb 1.3 oz (64.9 kg)  10/22/17 177 lb 12 oz (80.6 kg)  10/20/17 174 lb 1.9 oz (79 kg)    Nutrition Diagnosis  ? Overweight related to excessive energy intake as evidenced by a BMI of 28.9  Nutrition Intervention ? Pt's individual nutrition plan reviewed with pt.  Goal(s)  ? Pt to identify food quantities necessary to achieve weight loss of 5-8 lb at graduation from cardiac rehab.   Plan:  Pt to attend nutrition classes ? Nutrition I ? Nutrition II ? Portion Distortion  Will provide client-centered nutrition education as part of interdisciplinary care.   Monitor and evaluate progress toward nutrition goal with team.   Ross MarcusAubrey Burklin, MS, RD, LDN 12/20/2017 3:48 PM

## 2017-12-22 ENCOUNTER — Encounter (HOSPITAL_COMMUNITY)
Admission: RE | Admit: 2017-12-22 | Discharge: 2017-12-22 | Disposition: A | Discharge status: Home / Self Care | Payer: BLUE CROSS/BLUE SHIELD | Source: Ambulatory Visit | Attending: Cardiology | Admitting: Cardiology

## 2017-12-22 DIAGNOSIS — I110 Hypertensive heart disease with heart failure: Secondary | ICD-10-CM | POA: Diagnosis not present

## 2017-12-22 DIAGNOSIS — I5022 Chronic systolic (congestive) heart failure: Secondary | ICD-10-CM

## 2017-12-24 ENCOUNTER — Encounter (HOSPITAL_COMMUNITY)
Admission: RE | Admit: 2017-12-24 | Discharge: 2017-12-24 | Disposition: A | Discharge status: Home / Self Care | Payer: BLUE CROSS/BLUE SHIELD | Source: Ambulatory Visit | Attending: Cardiology | Admitting: Cardiology

## 2017-12-24 DIAGNOSIS — I5022 Chronic systolic (congestive) heart failure: Secondary | ICD-10-CM

## 2017-12-24 DIAGNOSIS — I110 Hypertensive heart disease with heart failure: Secondary | ICD-10-CM | POA: Diagnosis not present

## 2017-12-27 ENCOUNTER — Encounter (HOSPITAL_COMMUNITY)
Admission: RE | Admit: 2017-12-27 | Discharge: 2017-12-27 | Disposition: A | Discharge status: Home / Self Care | Payer: BLUE CROSS/BLUE SHIELD | Source: Ambulatory Visit | Attending: Cardiology | Admitting: Cardiology

## 2017-12-27 DIAGNOSIS — I5022 Chronic systolic (congestive) heart failure: Secondary | ICD-10-CM

## 2017-12-27 DIAGNOSIS — Z87891 Personal history of nicotine dependence: Secondary | ICD-10-CM | POA: Insufficient documentation

## 2017-12-27 DIAGNOSIS — I110 Hypertensive heart disease with heart failure: Secondary | ICD-10-CM | POA: Diagnosis not present

## 2017-12-27 DIAGNOSIS — Z79899 Other long term (current) drug therapy: Secondary | ICD-10-CM | POA: Insufficient documentation

## 2017-12-27 DIAGNOSIS — D569 Thalassemia, unspecified: Secondary | ICD-10-CM | POA: Insufficient documentation

## 2017-12-29 ENCOUNTER — Encounter (HOSPITAL_COMMUNITY)
Admission: RE | Admit: 2017-12-29 | Discharge: 2017-12-29 | Disposition: A | Discharge status: Home / Self Care | Payer: BLUE CROSS/BLUE SHIELD | Source: Ambulatory Visit | Attending: Cardiology | Admitting: Cardiology

## 2017-12-29 DIAGNOSIS — I5022 Chronic systolic (congestive) heart failure: Secondary | ICD-10-CM

## 2017-12-29 DIAGNOSIS — I110 Hypertensive heart disease with heart failure: Secondary | ICD-10-CM | POA: Diagnosis not present

## 2017-12-31 ENCOUNTER — Encounter (HOSPITAL_COMMUNITY)
Admission: RE | Admit: 2017-12-31 | Discharge: 2017-12-31 | Disposition: A | Discharge status: Home / Self Care | Payer: BLUE CROSS/BLUE SHIELD | Source: Ambulatory Visit | Attending: Cardiology | Admitting: Cardiology

## 2017-12-31 DIAGNOSIS — I110 Hypertensive heart disease with heart failure: Secondary | ICD-10-CM | POA: Diagnosis not present

## 2017-12-31 DIAGNOSIS — I5022 Chronic systolic (congestive) heart failure: Secondary | ICD-10-CM

## 2018-01-03 ENCOUNTER — Encounter (HOSPITAL_COMMUNITY)
Admission: RE | Admit: 2018-01-03 | Discharge: 2018-01-03 | Disposition: A | Discharge status: Home / Self Care | Payer: BLUE CROSS/BLUE SHIELD | Source: Ambulatory Visit | Attending: Cardiology | Admitting: Cardiology

## 2018-01-03 DIAGNOSIS — I5022 Chronic systolic (congestive) heart failure: Secondary | ICD-10-CM

## 2018-01-03 DIAGNOSIS — I110 Hypertensive heart disease with heart failure: Secondary | ICD-10-CM | POA: Diagnosis not present

## 2018-01-05 ENCOUNTER — Encounter (HOSPITAL_COMMUNITY)
Admission: RE | Admit: 2018-01-05 | Discharge: 2018-01-05 | Disposition: A | Discharge status: Home / Self Care | Payer: BLUE CROSS/BLUE SHIELD | Source: Ambulatory Visit | Attending: Cardiology | Admitting: Cardiology

## 2018-01-05 DIAGNOSIS — I5022 Chronic systolic (congestive) heart failure: Secondary | ICD-10-CM

## 2018-01-05 DIAGNOSIS — I110 Hypertensive heart disease with heart failure: Secondary | ICD-10-CM | POA: Diagnosis not present

## 2018-01-06 NOTE — Progress Notes (Signed)
Cardiac Individual Treatment Plan  Patient Details  Name: Melinda Allen MRN: [id number] Date of Birth: 09/18/1975 Referring Provider:     CARDIAC REHAB [facility]  Referring Provider  Quintella Reicherturner, Traci R MD       Initial Encounter Date:    CARDIAC REHAB PHASE II ORIENTATION [facility]  Date  12/09/17      Visit Diagnosis: Chronic systolic congestive heart failure (HCC)  Patient's Home Medications on Admission:  Current Outpatient Medications:  .  digoxin (LANOXIN) 0.125 MG tablet, Take 0.125 mg by mouth daily., Disp: , Rfl:  .  ferrous sulfate 325 (65 FE) MG tablet, Take 325 mg by mouth at bedtime. , Disp: , Rfl:  .  Fluticasone Furoate 50 MCG/ACT AEPB, Inhale 1 spray into the lungs daily as needed., Disp: , Rfl:  .  furosemide (LASIX) 20 MG tablet, Take 20 mg by mouth., Disp: , Rfl:  .  loratadine (CLARITIN) 10 MG tablet, Take 10 mg by mouth daily as needed for allergies., Disp: , Rfl:  .  Melatonin 1 MG CAPS, Take 1 capsule by mouth at bedtime as needed., Disp: , Rfl:  .  metoprolol succinate (TOPROL-XL) 25 MG 24 hr tablet, Take 25 mg by mouth daily., Disp: , Rfl:  .  Prenatal Vit-Fe Fumarate-FA (MULTIVITAMIN-PRENATAL) 27-0.8 MG TABS tablet, Take 1 tablet by mouth at bedtime. , Disp: , Rfl:  .  spironolactone (ALDACTONE) 25 MG tablet, Take 25 mg by mouth daily., Disp: , Rfl:  .  valsartan (DIOVAN) 160 MG tablet, Take 160 mg by mouth 2 (two) times daily., Disp: , Rfl:   Past Medical History: Past Medical History:  Diagnosis Date  . Hypertension   . Thalassemia, unspecified     Tobacco Use: Social History   Tobacco Use  Smoking Status Former Smoker  . Last attempt to quit: 03/01/2014  . Years since quitting: 3.8  Smokeless Tobacco Never Used    Labs: Recent Review Flowsheet Data    There is no flowsheet data to display.      Capillary Blood  Glucose: No results found for: GLUCAP   Exercise Target Goals:    Exercise Program Goal: Individual exercise prescription set using results from initial 6 min walk test and THRR while considering  patient's activity barriers and safety.   Exercise Prescription Goal: Initial exercise prescription builds to 30-45 minutes a day of aerobic activity, 2-3 days per week.  Home exercise guidelines will be given to patient during program as part of exercise prescription that the participant will acknowledge.  Activity Barriers & Risk Stratification: Activity Barriers & Cardiac Risk Stratification - 12/09/17 0854      Activity Barriers & Cardiac Risk Stratification   Activity Barriers  None    Cardiac Risk Stratification  High       6 Minute Walk: 6 Minute Walk    Row Name 12/09/17 1059         6 Minute Walk   Phase  Initial     Distance  1552 feet     Walk Time  6 minutes     # of Rest Breaks  0     MPH  2.94     METS  4.63     RPE  9     Perceived Dyspnea   0     VO2 Peak  16.19     Symptoms  No  Resting HR  82 bpm     Resting BP  102/40     Resting Oxygen Saturation   99 %     Exercise Oxygen Saturation  during 6 min walk  100 %     Max Ex. HR  110 bpm     Max Ex. BP  104/72     2 Minute Post BP  94/64        Oxygen Initial Assessment:   Oxygen Re-Evaluation:   Oxygen Discharge (Final Oxygen Re-Evaluation):   Initial Exercise Prescription: Initial Exercise Prescription - 12/09/17 1100      Date of Initial Exercise RX and Referring Provider   Date  12/09/17    Referring Provider  Quintella Reichert MD       Recumbant Bike   Level  2 [employer]    Watts  45    Minutes  10    METs  4.18      NuStep   Level  2    SPM  75    Minutes  10    METs  3.5      Track   Laps  13    Minutes  10    METs  3.3      Prescription Details   Frequency (times per week)  3x    Duration  Progress to 30 minutes of continuous aerobic without signs/symptoms of physical  distress      Intensity   THRR 40-80% of Max Heartrate  72-144    Ratings of Perceived Exertion  11-13    Perceived Dyspnea  0-4      Progression   Progression  Continue progressive overload as per policy without signs/symptoms or physical distress.      Resistance Training   Training Prescription  Yes    Weight  3lbs    Reps  10-15       Perform Capillary Blood Glucose checks as needed.  Exercise Prescription Changes: Exercise Prescription Changes    Row Name 12/13/17 1454 12/27/17 1449           Response to Exercise   Blood Pressure (Admit)  100/64  104/60      Blood Pressure (Exercise)  130/68  136/70      Blood Pressure (Exit)  104/70  98/68      Heart Rate (Admit)  97 bpm  93 bpm      Heart Rate (Exercise)  136 bpm  156 bpm      Heart Rate (Exit)  70 bpm  93 bpm      Rating of Perceived Exertion (Exercise)  11  12      Symptoms  none  none      Duration  Progress to 30 minutes of  aerobic without signs/symptoms of physical distress  Progress to 30 minutes of  aerobic without signs/symptoms of physical distress      Intensity  THRR unchanged  THRR unchanged        Progression   Progression  Continue to progress workloads to maintain intensity without signs/symptoms of physical distress.  Continue to progress workloads to maintain intensity without signs/symptoms of physical distress.      Average METs  3.1  3.2        Resistance Training   Training Prescription  Yes  Yes      Weight  3lbs  3lbs      Reps  10-15  10-15      Time  10  Minutes  10 Minutes        Interval Training   Interval Training  No  No        Bike   Level  1  -      Minutes  10  -      METs  3.91  -        Recumbant Bike   Level  -  3      Watts  -  -      Minutes  -  10      METs  -  3.3        NuStep   Level  2  4      SPM  75  85      Minutes  10  10      METs  2.4  3        Track   Laps  12  13      Minutes  10  10      METs  3.09  3.26        Home Exercise Plan    Plans to continue exercise at  -  Home (comment)      Frequency  -  Add 2 additional days to program exercise sessions.      Initial Home Exercises Provided  -  12/20/17         Exercise Comments: Exercise Comments    Row Name 12/13/17 1533 12/20/17 1530 12/27/17 1542       Exercise Comments  Reviewed METs with patient.  Reviewed home exercise guidelines, METs, and goals with patient.  Reviewed METs and goals with patient.        Exercise Goals and Review: Exercise Goals    Row Name 12/09/17 1100             Exercise Goals   Increase Physical Activity  Yes       Intervention  Provide advice, education, support and counseling about physical activity/exercise needs.;Develop an individualized exercise prescription for aerobic and resistive training based on initial evaluation findings, risk stratification, comorbidities and participant's personal goals.       Expected Outcomes  Short Term: Attend rehab on a regular basis to increase amount of physical activity.;Long Term: Add in home exercise to make exercise part of routine and to increase amount of physical activity.;Long Term: Exercising regularly at least 3-5 days a week.       Increase Strength and Stamina  Yes       Intervention  Provide advice, education, support and counseling about physical activity/exercise needs.;Develop an individualized exercise prescription for aerobic and resistive training based on initial evaluation findings, risk stratification, comorbidities and participant's personal goals.       Expected Outcomes  Short Term: Increase workloads from initial exercise prescription for resistance, speed, and METs.;Short Term: Perform resistance training exercises routinely during rehab and add in resistance training at home;Long Term: Improve cardiorespiratory fitness, muscular endurance and strength as measured by increased METs and functional capacity ( )       Able to understand and use rate of perceived exertion  (RPE) scale  Yes       Intervention  Provide education and explanation on how to use RPE scale       Expected Outcomes  Short Term: Able to use RPE daily in rehab to express subjective intensity level;Long Term:  Able to use RPE to guide intensity level when exercising independently  Knowledge and understanding of Target Heart Rate Range (THRR)  Yes       Intervention  Provide education and explanation of THRR including how the numbers were predicted and where they are located for reference       Expected Outcomes  Short Term: Able to state/look up THRR;Long Term: Able to use THRR to govern intensity when exercising independently;Short Term: Able to use daily as guideline for intensity in rehab       Able to check pulse independently  Yes       Intervention  Provide education and demonstration on how to check pulse in carotid and radial arteries.;Review the importance of being able to check your own pulse for safety during independent exercise       Expected Outcomes  Short Term: Able to explain why pulse checking is important during independent exercise;Long Term: Able to check pulse independently and accurately       Understanding of Exercise Prescription  Yes       Intervention  Provide education, explanation, and written materials on patient's individual exercise prescription       Expected Outcomes  Short Term: Able to explain program exercise prescription;Long Term: Able to explain home exercise prescription to exercise independently          Exercise Goals Re-Evaluation : Exercise Goals Re-Evaluation    Row Name 12/20/17 1530 12/27/17 1542           Exercise Goal Re-Evaluation   Exercise Goals Review  Understanding of Exercise Prescription;Knowledge and understanding of Target Heart Rate Range (THRR);Able to understand and use rate of perceived exertion (RPE) scale  Increase Strength and Stamina      Comments  Reviewed home exercise guidelines with patient including THRR, RPE  scale and endpoints for exercise.  Patient is walking 40-45 minutes at least 2 days/week in addition to exercise at [facility].      Expected Outcomes  Patient will walk 30-45 minutes, 2 days/week in addition to exercise at CR to help improve strength and stamina.  Continue to progress workloads to help improve strength and stamina, increase hand weights as tolerated to help improve upper body strength.          Discharge Exercise Prescription (Final Exercise Prescription Changes): Exercise Prescription Changes - 12/27/17 1449      Response to Exercise   Blood Pressure (Admit)  104/60    Blood Pressure (Exercise)  136/70    Blood Pressure (Exit)  98/68    Heart Rate (Admit)  93 bpm    Heart Rate (Exercise)  156 bpm    Heart Rate (Exit)  93 bpm    Rating of Perceived Exertion (Exercise)  12    Symptoms  none    Duration  Progress to 30 minutes of  aerobic without signs/symptoms of physical distress    Intensity  THRR unchanged      Progression   Progression  Continue to progress workloads to maintain intensity without signs/symptoms of physical distress.    Average METs  3.2      Resistance Training   Training Prescription  Yes    Weight  3lbs    Reps  10-15    Time  10 Minutes      Interval Training   Interval Training  No      Bike   Level  --    Minutes  --    METs  --      Recumbant Bike   Level  3    Minutes  10    METs  3.3      NuStep   Level  4    SPM  85    Minutes  10    METs  3      Track   Laps  13    Minutes  10    METs  3.26      Home Exercise Plan   Plans to continue exercise at  Home (comment)    Frequency  Add 2 additional days to program exercise sessions.    Initial Home Exercises Provided  12/20/17       Nutrition:  Target Goals: Understanding of nutrition guidelines, daily intake of sodium 1500mg , cholesterol 200mg , calories 30% from fat and 7% or less from saturated fats, daily to have 5 or more servings of fruits and  vegetables.  Biometrics: Pre Biometrics - 12/09/17 1101      Pre Biometrics   Height  4\' 11"  (1.499 m)    Weight  143 lb 1.3 oz (64.9 kg)    Waist Circumference  36.5 inches    Hip Circumference  39 inches    Waist to Hip Ratio  0.94 %    BMI (Calculated)  28.88    Triceps Skinfold  28 mm    % Body Fat  39 %    Grip Strength  26 kg    Flexibility  13 in    Single Leg Stand  30 seconds        Nutrition Therapy Plan and Nutrition Goals: Nutrition Therapy & Goals - 12/09/17 1152      Nutrition Therapy   Diet  Heart Healthy      Personal Nutrition Goals   Nutrition Goal  Pt to identify food quantities necessary to achieve weight loss of 5-8 lb at graduation from cardiac rehab. Goal wt of 135 lb desired.       Intervention Plan   Intervention  Prescribe, educate and counsel regarding individualized specific dietary modifications aiming towards targeted core components such as weight, hypertension, lipid management, diabetes, heart failure and other comorbidities.    Expected Outcomes  Short Term Goal: Understand basic principles of dietary content, such as calories, fat, sodium, cholesterol and nutrients.;Long Term Goal: Adherence to prescribed nutrition plan.       Nutrition Assessments: Nutrition Assessments - 12/09/17 1152      MEDFICTS Scores   Pre Score  30       Nutrition Goals Re-Evaluation:   Nutrition Goals Re-Evaluation:   Nutrition Goals Discharge (Final Nutrition Goals Re-Evaluation):   Psychosocial: Target Goals: Acknowledge presence or absence of significant depression and/or stress, maximize coping skills, provide positive support system. Participant is able to verbalize types and ability to use techniques and skills needed for reducing stress and depression.  Initial Review & Psychosocial Screening: Initial Psych Review & Screening - 12/09/17 0948      Initial Review   Current issues with  Current Stress Concerns    Source of Stress Concerns   Unable to participate in former interests or hobbies    Comments  new baby with unexpected heart failure postpartum complications       Family Dynamics   Good Support System?  Yes spouse, parents, in laws       Barriers   Psychosocial barriers to participate in program  There are no identifiable barriers or psychosocial needs.      Screening Interventions   Interventions  Encouraged to exercise  Quality of Life Scores: Quality of Life - 12/09/17 1103      Quality of Life Scores   Health/Function Pre  28 %    Socioeconomic Pre  30 %    Psych/Spiritual Pre  30 %    Family Pre  30 %    GLOBAL Pre  29.14 %      Scores of 19 and below usually indicate a poorer quality of life in these areas.  A difference of  2-3 points is a clinically meaningful difference.  A difference of 2-3 points in the total score of the Quality of Life Index has been associated with significant improvement in overall quality of life, self-image, physical symptoms, and general health in studies assessing change in quality of life.  PHQ-9: Recent Review Flowsheet Data    Depression screen [facility] 2/9 12/13/2017   Decreased Interest 0   Down, Depressed, Hopeless 0   PHQ - 2 Score 0     Interpretation of Total Score  Total Score Depression Severity:  1-4 = Minimal depression, 5-9 = Mild depression, 10-14 = Moderate depression, 15-19 = Moderately severe depression, 20-27 = Severe depression   Psychosocial Evaluation and Intervention: Psychosocial Evaluation - 12/13/17 1723      Psychosocial Evaluation & Interventions   Interventions  Stress management education;Relaxation education;Encouraged to exercise with the program and follow exercise prescription    Comments  pt with new onset chronic illness as young mother demonstrates health related stress and anxiety. otherwise no psychosocial needs identified, no interventions necessary     Expected Outcomes  pt will exhibit positive outlook with good coping  skills.     Continue Psychosocial Services   Follow up required by staff       Psychosocial Re-Evaluation: Psychosocial Re-Evaluation    Row Name 01/06/18 1604             Psychosocial Re-Evaluation   Current issues with  Current Stress Concerns       Comments  Melinda Allen has been feeling better since partcipating in phase 2 cardiac rehab       Interventions  Stress management education;Encouraged to attend Cardiac Rehabilitation for the exercise       Continue Psychosocial Services   Follow up required by staff       Comments  Melinda Allen is feeling better and has started driving again          Psychosocial Discharge (Final Psychosocial Re-Evaluation): Psychosocial Re-Evaluation - 01/06/18 1604      Psychosocial Re-Evaluation   Current issues with  Current Stress Concerns    Comments  Melinda Allen has been feeling better since partcipating in phase 2 cardiac rehab    Interventions  Stress management education;Encouraged to attend Cardiac Rehabilitation for the exercise    Continue Psychosocial Services   Follow up required by staff    Comments  Melinda Allen is feeling better and has started driving again       Vocational Rehabilitation: Provide vocational rehab assistance to qualifying candidates.   Vocational Rehab Evaluation & Intervention: Vocational Rehab - 12/09/17 0947      Initial Vocational Rehab Evaluation & Intervention   Assessment shows need for Vocational Rehabilitation  No       Education: Education Goals: Education classes will be provided on a weekly basis, covering required topics. Participant will state understanding/return demonstration of topics presented.  Learning Barriers/Preferences: Learning Barriers/Preferences - 12/09/17 1057      Learning Barriers/Preferences   Learning Barriers  None  Learning Preferences  Written Material       Education Topics: Count Your Pulse:  -Group instruction provided by verbal instruction, demonstration,  patient participation and written materials to support subject.  Instructors address importance of being able to find your pulse and how to count your pulse when at home without a heart monitor.  Patients get hands on experience counting their pulse with staff help and individually.   CARDIAC REHAB PHASE II EXERCISE from 12/24/2017 in [facility] CARDIAC REHAB  Date  12/24/17  Educator  Lynnda Child  Instruction Review Code  2- Demonstrated Understanding      Heart Attack, Angina, and Risk Factor Modification:  -Group instruction provided by verbal instruction, video, and written materials to support subject.  Instructors address signs and symptoms of angina and heart attacks.    Also discuss risk factors for heart disease and how to make changes to improve heart health risk factors.   CARDIAC REHAB PHASE II EXERCISE from 12/24/2017 in [facility] CARDIAC REHAB  Date  12/15/17  Instruction Review Code  2- Demonstrated Understanding      Functional Fitness:  -Group instruction provided by verbal instruction, demonstration, patient participation, and written materials to support subject.  Instructors address safety measures for doing things around the house.  Discuss how to get up and down off the floor, how to pick things up properly, how to safely get out of a chair without assistance, and balance training.   Meditation and Mindfulness:  -Group instruction provided by verbal instruction, patient participation, and written materials to support subject.  Instructor addresses importance of mindfulness and meditation practice to help reduce stress and improve awareness.  Instructor also leads participants through a meditation exercise.    Stretching for Flexibility and Mobility:  -Group instruction provided by verbal instruction, patient participation, and written materials to support subject.  Instructors lead participants through series of stretches that are  designed to increase flexibility thus improving mobility.  These stretches are additional exercise for major muscle groups that are typically performed during regular warm up and cool down.   Hands Only CPR:  -Group verbal, video, and participation provides a basic overview of AHA guidelines for community CPR. Role-play of emergencies allow participants the opportunity to practice calling for help and chest compression technique with discussion of AED use.   Hypertension: -Group verbal and written instruction that provides a basic overview of hypertension including the most recent diagnostic guidelines, risk factor reduction with self-care instructions and medication management.    Nutrition I class: Heart Healthy Eating:  -Group instruction provided by PowerPoint slides, verbal discussion, and written materials to support subject matter. The instructor gives an explanation and review of the Therapeutic Lifestyle Changes diet recommendations, which includes a discussion on lipid goals, dietary fat, sodium, fiber, plant stanol/sterol esters, sugar, and the components of a well-balanced, healthy diet.   Nutrition II class: Lifestyle Skills:  -Group instruction provided by PowerPoint slides, verbal discussion, and written materials to support subject matter. The instructor gives an explanation and review of label reading, grocery shopping for heart health, heart healthy recipe modifications, and ways to make healthier choices when eating out.   Diabetes Question & Answer:  -Group instruction provided by PowerPoint slides, verbal discussion, and written materials to support subject matter. The instructor gives an explanation and review of diabetes co-morbidities, pre- and post-prandial blood glucose goals, pre-exercise blood glucose goals, signs, symptoms, and treatment of hypoglycemia and hyperglycemia, and foot care basics.  Diabetes Blitz:  -Group instruction provided by PowerPoint slides,  verbal discussion, and written materials to support subject matter. The instructor gives an explanation and review of the physiology behind type 1 and type 2 diabetes, diabetes medications and rational behind using different medications, pre- and post-prandial blood glucose recommendations and Hemoglobin A1c goals, diabetes diet, and exercise including blood glucose guidelines for exercising safely.    Portion Distortion:  -Group instruction provided by PowerPoint slides, verbal discussion, written materials, and food models to support subject matter. The instructor gives an explanation of serving size versus portion size, changes in portions sizes over the last 20 years, and what consists of a serving from each food group.   Stress Management:  -Group instruction provided by verbal instruction, video, and written materials to support subject matter.  Instructors review role of stress in heart disease and how to cope with stress positively.     Exercising on Your Own:  -Group instruction provided by verbal instruction, power point, and written materials to support subject.  Instructors discuss benefits of exercise, components of exercise, frequency and intensity of exercise, and end points for exercise.  Also discuss use of nitroglycerin and activating EMS.  Review options of places to exercise outside of rehab.  Review guidelines for sex with heart disease.   CARDIAC REHAB PHASE II EXERCISE from 12/24/2017 in [facility] CARDIAC REHAB  Date  12/22/17  Educator  EP  Instruction Review Code  2- Demonstrated Understanding      Cardiac Drugs I:  -Group instruction provided by verbal instruction and written materials to support subject.  Instructor reviews cardiac drug classes: antiplatelets, anticoagulants, beta blockers, and statins.  Instructor discusses reasons, side effects, and lifestyle considerations for each drug class.   Cardiac Drugs II:  -Group instruction provided by  verbal instruction and written materials to support subject.  Instructor reviews cardiac drug classes: angiotensin converting enzyme inhibitors (ACE-I), angiotensin II receptor blockers (ARBs), nitrates, and calcium channel blockers.  Instructor discusses reasons, side effects, and lifestyle considerations for each drug class.   Anatomy and Physiology of the Circulatory System:  Group verbal and written instruction and models provide basic cardiac anatomy and physiology, with the coronary electrical and arterial systems. Review of: AMI, Angina, Valve disease, Heart Failure, Peripheral Artery Disease, Cardiac Arrhythmia, Pacemakers, and the ICD.   Other Education:  -Group or individual verbal, written, or video instructions that support the educational goals of the cardiac rehab program.   Holiday Eating Survival Tips:  -Group instruction provided by PowerPoint slides, verbal discussion, and written materials to support subject matter. The instructor gives patients tips, tricks, and techniques to help them not only survive but enjoy the holidays despite the onslaught of food that accompanies the holidays.   Knowledge Questionnaire Score: Knowledge Questionnaire Score - 12/09/17 0947      Knowledge Questionnaire Score   Pre Score  22/24       Core Components/Risk Factors/Patient Goals at Admission: Personal Goals and Risk Factors at Admission - 12/09/17 1049      Core Components/Risk Factors/Patient Goals on Admission    Weight Management  Yes;Weight Loss    Intervention  Weight Management: Develop a combined nutrition and exercise program designed to reach desired caloric intake, while maintaining appropriate intake of nutrient and fiber, sodium and fats, and appropriate energy expenditure required for the weight goal.;Weight Management: Provide education and appropriate resources to help participant work on and attain dietary goals.;Weight Management/Obesity: Establish reasonable short  term and  long term weight goals.    Admit Weight  143 lb 1.3 oz (64.9 kg)    Goal Weight: Short Term  136 lb (61.7 kg)    Goal Weight: Long Term  130 lb (59 kg)    Expected Outcomes  Short Term: Continue to assess and modify interventions until short term weight is achieved;Long Term: Adherence to nutrition and physical activity/exercise program aimed toward attainment of established weight goal;Weight Loss: Understanding of general recommendations for a balanced deficit meal plan, which promotes 1-2 lb weight loss per week and includes a negative energy balance of [phone] kcal/d;Understanding recommendations for meals to include 15-35% energy as protein, 25-35% energy from fat, 35-60% energy from carbohydrates, less than 200mg  of dietary cholesterol, 20-35 gm of total fiber daily;Understanding of distribution of calorie intake throughout the day with the consumption of 4-5 meals/snacks    Heart Failure  Yes    Intervention  Provide a combined exercise and nutrition program that is supplemented with education, support and counseling about heart failure. Directed toward relieving symptoms such as shortness of breath, decreased exercise tolerance, and extremity edema.    Expected Outcomes  Improve functional capacity of life;Long term: Adoption of self-care skills and reduction of barriers for early signs and symptoms recognition and intervention leading to self-care maintenance.;Short term: Daily weights obtained and reported for increase. Utilizing diuretic protocols set by physician.;Short term: Attendance in program 2-3 days a week with increased exercise capacity. Reported lower sodium intake. Reported increased fruit and vegetable intake. Reports medication compliance.       Core Components/Risk Factors/Patient Goals Review:  Goals and Risk Factor Review    Row Name 12/13/17 1721 01/06/18 1603           Core Components/Risk Factors/Patient Goals Review   Personal Goals Review  Weight  Management/Obesity;Heart Failure  Weight Management/Obesity;Heart Failure      Review  pt demonstrates eagerness to lose weight and learn more about improving heart function.  pt eager to increase strength/stamina to be able to resume carrying her 2 yo child.   pt demonstrates eagerness to lose weight and learn more about improving heart function.  pt eager to increase strength/stamina to be able to resume carrying her 2 yo child.       Expected Outcomes  pt will participate in CR exercise, nutrition and lifestyle modification education opportunities to decrease overall RF.   pt will participate in CR exercise, nutrition and lifestyle modification education opportunities to decrease overall RF.          Core Components/Risk Factors/Patient Goals at Discharge (Final Review):  Goals and Risk Factor Review - 01/06/18 1603      Core Components/Risk Factors/Patient Goals Review   Personal Goals Review  Weight Management/Obesity;Heart Failure    Review  pt demonstrates eagerness to lose weight and learn more about improving heart function.  pt eager to increase strength/stamina to be able to resume carrying her 2 yo child.     Expected Outcomes  pt will participate in CR exercise, nutrition and lifestyle modification education opportunities to decrease overall RF.        ITP Comments: ITP Comments    Row Name 12/09/17 0819 12/13/17 1720 01/06/18 1602       ITP Comments  Dr. Armanda Magic, Medical Director   pt started group exercise sessions. pt tolerated light activity without difficulty. pt oriented to exercise equipment and safety routine.   30 day ITP Review. Patient with good participation and attendance at phase  2 cardiac rehab.        Comments: See ITP comments.Melinda Lighter, RN,BSN 01/06/2018 4:15 PM

## 2018-01-07 ENCOUNTER — Encounter (HOSPITAL_COMMUNITY)
Admission: RE | Admit: 2018-01-07 | Discharge: 2018-01-07 | Disposition: A | Discharge status: Home / Self Care | Payer: BLUE CROSS/BLUE SHIELD | Source: Ambulatory Visit | Attending: Cardiology | Admitting: Cardiology

## 2018-01-07 DIAGNOSIS — I5022 Chronic systolic (congestive) heart failure: Secondary | ICD-10-CM

## 2018-01-07 DIAGNOSIS — I110 Hypertensive heart disease with heart failure: Secondary | ICD-10-CM | POA: Diagnosis not present

## 2018-01-10 ENCOUNTER — Encounter (HOSPITAL_COMMUNITY)
Admission: RE | Admit: 2018-01-10 | Discharge: 2018-01-10 | Disposition: A | Discharge status: Home / Self Care | Payer: BLUE CROSS/BLUE SHIELD | Source: Ambulatory Visit | Attending: Cardiology | Admitting: Cardiology

## 2018-01-10 DIAGNOSIS — I5022 Chronic systolic (congestive) heart failure: Secondary | ICD-10-CM

## 2018-01-10 DIAGNOSIS — I110 Hypertensive heart disease with heart failure: Secondary | ICD-10-CM | POA: Diagnosis not present

## 2018-01-12 ENCOUNTER — Encounter (HOSPITAL_COMMUNITY): Payer: BLUE CROSS/BLUE SHIELD

## 2018-01-14 ENCOUNTER — Encounter (HOSPITAL_COMMUNITY)
Admission: RE | Admit: 2018-01-14 | Discharge: 2018-01-14 | Disposition: A | Discharge status: Home / Self Care | Payer: BLUE CROSS/BLUE SHIELD | Source: Ambulatory Visit | Attending: Cardiology | Admitting: Cardiology

## 2018-01-14 DIAGNOSIS — I5022 Chronic systolic (congestive) heart failure: Secondary | ICD-10-CM

## 2018-01-14 DIAGNOSIS — I110 Hypertensive heart disease with heart failure: Secondary | ICD-10-CM | POA: Diagnosis not present

## 2018-01-17 ENCOUNTER — Encounter (HOSPITAL_COMMUNITY)
Admission: RE | Admit: 2018-01-17 | Discharge: 2018-01-17 | Disposition: A | Discharge status: Home / Self Care | Payer: BLUE CROSS/BLUE SHIELD | Source: Ambulatory Visit | Attending: Cardiology | Admitting: Cardiology

## 2018-01-17 DIAGNOSIS — I5022 Chronic systolic (congestive) heart failure: Secondary | ICD-10-CM

## 2018-01-17 DIAGNOSIS — I110 Hypertensive heart disease with heart failure: Secondary | ICD-10-CM | POA: Diagnosis not present

## 2018-01-19 ENCOUNTER — Encounter (HOSPITAL_COMMUNITY)
Admission: RE | Admit: 2018-01-19 | Discharge: 2018-01-19 | Disposition: A | Discharge status: Home / Self Care | Payer: BLUE CROSS/BLUE SHIELD | Source: Ambulatory Visit | Attending: Cardiology | Admitting: Cardiology

## 2018-01-19 DIAGNOSIS — I5022 Chronic systolic (congestive) heart failure: Secondary | ICD-10-CM

## 2018-01-19 DIAGNOSIS — I110 Hypertensive heart disease with heart failure: Secondary | ICD-10-CM | POA: Diagnosis not present

## 2018-01-21 ENCOUNTER — Encounter (HOSPITAL_COMMUNITY)
Admission: RE | Admit: 2018-01-21 | Discharge: 2018-01-21 | Disposition: A | Discharge status: Home / Self Care | Payer: BLUE CROSS/BLUE SHIELD | Source: Ambulatory Visit | Attending: Cardiology | Admitting: Cardiology

## 2018-01-21 DIAGNOSIS — I110 Hypertensive heart disease with heart failure: Secondary | ICD-10-CM | POA: Diagnosis not present

## 2018-01-21 DIAGNOSIS — I5022 Chronic systolic (congestive) heart failure: Secondary | ICD-10-CM

## 2018-01-24 ENCOUNTER — Encounter (HOSPITAL_COMMUNITY)
Admission: RE | Admit: 2018-01-24 | Discharge: 2018-01-24 | Disposition: A | Discharge status: Home / Self Care | Payer: BLUE CROSS/BLUE SHIELD | Source: Ambulatory Visit | Attending: Cardiology | Admitting: Cardiology

## 2018-01-24 DIAGNOSIS — I110 Hypertensive heart disease with heart failure: Secondary | ICD-10-CM | POA: Diagnosis not present

## 2018-01-24 DIAGNOSIS — I5022 Chronic systolic (congestive) heart failure: Secondary | ICD-10-CM

## 2018-01-26 ENCOUNTER — Encounter (HOSPITAL_COMMUNITY): Payer: BLUE CROSS/BLUE SHIELD

## 2018-01-28 ENCOUNTER — Encounter (HOSPITAL_COMMUNITY)
Admission: RE | Admit: 2018-01-28 | Discharge: 2018-01-28 | Disposition: A | Discharge status: Home / Self Care | Payer: BLUE CROSS/BLUE SHIELD | Source: Ambulatory Visit | Attending: Cardiology | Admitting: Cardiology

## 2018-01-28 DIAGNOSIS — I5022 Chronic systolic (congestive) heart failure: Secondary | ICD-10-CM | POA: Insufficient documentation

## 2018-01-28 DIAGNOSIS — D569 Thalassemia, unspecified: Secondary | ICD-10-CM | POA: Diagnosis not present

## 2018-01-28 DIAGNOSIS — Z87891 Personal history of nicotine dependence: Secondary | ICD-10-CM | POA: Diagnosis not present

## 2018-01-28 DIAGNOSIS — I110 Hypertensive heart disease with heart failure: Secondary | ICD-10-CM | POA: Diagnosis not present

## 2018-01-28 DIAGNOSIS — Z79899 Other long term (current) drug therapy: Secondary | ICD-10-CM | POA: Diagnosis not present

## 2018-01-31 ENCOUNTER — Encounter (HOSPITAL_COMMUNITY)
Admission: RE | Admit: 2018-01-31 | Discharge: 2018-01-31 | Disposition: A | Discharge status: Home / Self Care | Payer: BLUE CROSS/BLUE SHIELD | Source: Ambulatory Visit | Attending: Cardiology | Admitting: Cardiology

## 2018-01-31 DIAGNOSIS — I110 Hypertensive heart disease with heart failure: Secondary | ICD-10-CM | POA: Diagnosis not present

## 2018-01-31 DIAGNOSIS — I5022 Chronic systolic (congestive) heart failure: Secondary | ICD-10-CM

## 2018-02-02 ENCOUNTER — Encounter (HOSPITAL_COMMUNITY)
Admission: RE | Admit: 2018-02-02 | Discharge: 2018-02-02 | Disposition: A | Discharge status: Home / Self Care | Payer: BLUE CROSS/BLUE SHIELD | Source: Ambulatory Visit | Attending: Cardiology | Admitting: Cardiology

## 2018-02-02 DIAGNOSIS — I5022 Chronic systolic (congestive) heart failure: Secondary | ICD-10-CM

## 2018-02-02 DIAGNOSIS — I110 Hypertensive heart disease with heart failure: Secondary | ICD-10-CM | POA: Diagnosis not present

## 2018-02-03 NOTE — Progress Notes (Signed)
Cardiac Individual Treatment Plan  Patient Details  Name: KHALIA GONG MRN: [id number] Date of Birth: [birth year] Referring Provider:     CARDIAC REHAB PHASE II ORIENTATION from 12/09/2017 in [facility] CARDIAC REHAB  Referring Provider  Quintella Reichert MD       Initial Encounter Date:    CARDIAC REHAB PHASE II ORIENTATION from 12/09/2017 in [facility] CARDIAC REHAB  Date  12/09/17      Visit Diagnosis: Chronic systolic congestive heart failure (HCC)  Patient's Home Medications on Admission:  Current Outpatient Medications:  .  digoxin (LANOXIN) 0.125 MG tablet, Take 0.125 mg by mouth daily., Disp: , Rfl:  .  ferrous sulfate 325 (65 FE) MG tablet, Take 325 mg by mouth at bedtime. , Disp: , Rfl:  .  Fluticasone Furoate 50 MCG/ACT AEPB, Inhale 1 spray into the lungs daily as needed., Disp: , Rfl:  .  furosemide (LASIX) 20 MG tablet, Take 20 mg by mouth., Disp: , Rfl:  .  loratadine (CLARITIN) 10 MG tablet, Take 10 mg by mouth daily as needed for allergies., Disp: , Rfl:  .  Melatonin 1 MG CAPS, Take 1 capsule by mouth at bedtime as needed., Disp: , Rfl:  .  metoprolol succinate (TOPROL-XL) 25 MG 24 hr tablet, Take 25 mg by mouth daily., Disp: , Rfl:  .  Prenatal Vit-Fe Fumarate-FA (MULTIVITAMIN-PRENATAL) 27-0.8 MG TABS tablet, Take 1 tablet by mouth at bedtime. , Disp: , Rfl:  .  spironolactone (ALDACTONE) 25 MG tablet, Take 25 mg by mouth daily., Disp: , Rfl:  .  valsartan (DIOVAN) 160 MG tablet, Take 160 mg by mouth 2 (two) times daily., Disp: , Rfl:   Past Medical History: Past Medical History:  Diagnosis Date  . Hypertension   . Thalassemia, unspecified     Tobacco Use: Social History   Tobacco Use  Smoking Status Former Smoker  . Last attempt to quit: 03/01/2014  . Years since quitting: 3.9  Smokeless Tobacco Never Used    Labs: Recent Review Flowsheet Data    There is no flowsheet data to display.      Capillary Blood  Glucose: No results found for: GLUCAP   Exercise Target Goals:    Exercise Program Goal: Individual exercise prescription set using results from initial 6 min walk test and THRR while considering  patient's activity barriers and safety.   Exercise Prescription Goal: Initial exercise prescription builds to 30-45 minutes a day of aerobic activity, 2-3 days per week.  Home exercise guidelines will be given to patient during program as part of exercise prescription that the participant will acknowledge.  Activity Barriers & Risk Stratification: Activity Barriers & Cardiac Risk Stratification - 12/09/17 0854      Activity Barriers & Cardiac Risk Stratification   Activity Barriers  None    Cardiac Risk Stratification  High       6 Minute Walk: 6 Minute Walk    Row Name 12/09/17 1059         6 Minute Walk   Phase  Initial     Distance  1552 feet     Walk Time  6 minutes     # of Rest Breaks  0     MPH  2.94     METS  4.63     RPE  9     Perceived Dyspnea   0     VO2 Peak  16.19     Symptoms  No  Resting HR  82 bpm     Resting BP  102/40     Resting Oxygen Saturation   99 %     Exercise Oxygen Saturation  during 6 min walk  100 %     Max Ex. HR  110 bpm     Max Ex. BP  104/72     2 Minute Post BP  94/64        Oxygen Initial Assessment:   Oxygen Re-Evaluation:   Oxygen Discharge (Final Oxygen Re-Evaluation):   Initial Exercise Prescription: Initial Exercise Prescription - 12/09/17 1100      Date of Initial Exercise RX and Referring Provider   Date  12/09/17    Referring Provider  Quintella Reichert MD       Recumbant Bike   Level  2    Watts  45    Minutes  10    METs  4.18      NuStep   Level  2    SPM  75    Minutes  10    METs  3.5      Track   Laps  13    Minutes  10    METs  3.3      Prescription Details   Frequency (times per week)  3x    Duration  Progress to 30 minutes of continuous aerobic without signs/symptoms of physical distress       Intensity   THRR 40-80% of Max Heartrate  72-144    Ratings of Perceived Exertion  11-13    Perceived Dyspnea  0-4      Progression   Progression  Continue progressive overload as per policy without signs/symptoms or physical distress.      Resistance Training   Training Prescription  Yes    Weight  3lbs    Reps  10-15       Perform Capillary Blood Glucose checks as needed.  Exercise Prescription Changes: Exercise Prescription Changes    Row Name 12/13/17 1454 12/27/17 1449 01/10/18 1449 01/24/18 1449       Response to Exercise   Blood Pressure (Admit)  100/64  104/60  108/60  110/58    Blood Pressure (Exercise)  130/68  136/70  134/64  122/78    Blood Pressure (Exit)  104/70  98/68  92/60  93/61    Heart Rate (Admit)  97 bpm  93 bpm  82 bpm  84 bpm    Heart Rate (Exercise)  136 bpm  156 bpm  150 bpm  152 bpm    Heart Rate (Exit)  70 bpm  93 bpm  81 bpm  84 bpm    Rating of Perceived Exertion (Exercise)  11  12  13  12     Symptoms  none  none  none  none    Duration  Progress to 30 minutes of  aerobic without signs/symptoms of physical distress  Progress to 30 minutes of  aerobic without signs/symptoms of physical distress  Progress to 30 minutes of  aerobic without signs/symptoms of physical distress  Progress to 30 minutes of  aerobic without signs/symptoms of physical distress    Intensity  THRR unchanged  THRR unchanged  THRR unchanged  THRR unchanged      Progression   Progression  Continue to progress workloads to maintain intensity without signs/symptoms of physical distress.  Continue to progress workloads to maintain intensity without signs/symptoms of physical distress.  Continue to progress workloads to maintain  intensity without signs/symptoms of physical distress.  Continue to progress workloads to maintain intensity without signs/symptoms of physical distress.    Average METs  3.1  3.2  3.3  3.5      Resistance Training   Training Prescription  Yes  Yes   Yes  Yes    Weight  3lbs  3lbs  4lbs  4lbs    Reps  10-15  10-15  10-15  10-15    Time  10 Minutes  10 Minutes  10 Minutes  10 Minutes      Interval Training   Interval Training  No  No  No  No      Bike   Level  1  -  -  -    Minutes  10  -  -  -    METs  3.91  -  -  -      Recumbant Bike   Level  -  3  3  3.5    Watts  -  -  -  -    Minutes  -  10  10  10     METs  -  3.3  3.2  3.2      NuStep   Level  2  4  5  6     SPM  75  85  85  85    Minutes  10  10  10  10     METs  2.4  3  3.5  4      Track   Laps  12  13  13  14     Minutes  10  10  10  10     METs  3.09  3.26  3.26  3.43      Home Exercise Plan   Plans to continue exercise at  -  Home (comment)  Home (comment)  Home (comment)    Frequency  -  Add 2 additional days to program exercise sessions.  Add 2 additional days to program exercise sessions.  Add 2 additional days to program exercise sessions.    Initial Home Exercises Provided  -  12/20/17  12/20/17  12/20/17       Exercise Comments: Exercise Comments    Row Name 12/13/17 1533 12/20/17 1530 12/27/17 1542 01/10/18 1514 01/24/18 1502   Exercise Comments  Reviewed METs with patient.  Reviewed home exercise guidelines, METs, and goals with patient.  Reviewed METs and goals with patient.  Reviewed METs with patient.  Reviewed METs and goals with patient.   Row Name 02/02/18 1445           Exercise Comments  Received clearance from cardiologist to increase THRR to 171 bpm.          Exercise Goals and Review: Exercise Goals    Row Name 12/09/17 1100             Exercise Goals   Increase Physical Activity  Yes       Intervention  Provide advice, education, support and counseling about physical activity/exercise needs.;Develop an individualized exercise prescription for aerobic and resistive training based on initial evaluation findings, risk stratification, comorbidities and participant's personal goals.       Expected Outcomes  Short Term: Attend  rehab on a regular basis to increase amount of physical activity.;Long Term: Add in home exercise to make exercise part of routine and to increase amount of physical activity.;Long Term: Exercising regularly at least 3-5 days a week.  Increase Strength and Stamina  Yes       Intervention  Provide advice, education, support and counseling about physical activity/exercise needs.;Develop an individualized exercise prescription for aerobic and resistive training based on initial evaluation findings, risk stratification, comorbidities and participant's personal goals.       Expected Outcomes  Short Term: Increase workloads from initial exercise prescription for resistance, speed, and METs.;Short Term: Perform resistance training exercises routinely during rehab and add in resistance training at home;Long Term: Improve cardiorespiratory fitness, muscular endurance and strength as measured by increased METs and functional capacity ( )       Able to understand and use rate of perceived exertion (RPE) scale  Yes       Intervention  Provide education and explanation on how to use RPE scale       Expected Outcomes  Short Term: Able to use RPE daily in rehab to express subjective intensity level;Long Term:  Able to use RPE to guide intensity level when exercising independently       Knowledge and understanding of Target Heart Rate Range (THRR)  Yes       Intervention  Provide education and explanation of THRR including how the numbers were predicted and where they are located for reference       Expected Outcomes  Short Term: Able to state/look up THRR;Long Term: Able to use THRR to govern intensity when exercising independently;Short Term: Able to use daily as guideline for intensity in rehab       Able to check pulse independently  Yes       Intervention  Provide education and demonstration on how to check pulse in carotid and radial arteries.;Review the importance of being able to check your own pulse for  safety during independent exercise       Expected Outcomes  Short Term: Able to explain why pulse checking is important during independent exercise;Long Term: Able to check pulse independently and accurately       Understanding of Exercise Prescription  Yes       Intervention  Provide education, explanation, and written materials on patient's individual exercise prescription       Expected Outcomes  Short Term: Able to explain program exercise prescription;Long Term: Able to explain home exercise prescription to exercise independently          Exercise Goals Re-Evaluation : Exercise Goals Re-Evaluation    Row Name 12/20/17 1530 12/27/17 1542 01/24/18 1449         Exercise Goal Re-Evaluation   Exercise Goals Review  Understanding of Exercise Prescription;Knowledge and understanding of Target Heart Rate Range (THRR);Able to understand and use rate of perceived exertion (RPE) scale  Increase Strength and Stamina  Increase Strength and Stamina     Comments  Reviewed home exercise guidelines with patient including THRR, RPE scale and endpoints for exercise.  Patient is walking 40-45 minutes at least 2 days/week in addition to exercise at [facility].  Patient is continuing walking routine at home in addition to exercise at [facility]. Pt returns to work on 01/26/18. Pt would like to increase upper body strength, therefore will switch from recumbent bike to rower.     Expected Outcomes  Patient will walk 30-45 minutes, 2 days/week in addition to exercise at CR to help improve strength and stamina.  Continue to progress workloads to help improve strength and stamina, increase hand weights as tolerated to help improve upper body strength.  Patient will switch to rower to help increase upper body  strength in addition to increasing hand weights.         Discharge Exercise Prescription (Final Exercise Prescription Changes): Exercise Prescription Changes - 01/24/18 1449      Response to Exercise   Blood Pressure (Admit)   110/58    Blood Pressure (Exercise)  122/78    Blood Pressure (Exit)  93/61    Heart Rate (Admit)  84 bpm    Heart Rate (Exercise)  152 bpm    Heart Rate (Exit)  84 bpm    Rating of Perceived Exertion (Exercise)  12    Symptoms  none    Duration  Progress to 30 minutes of  aerobic without signs/symptoms of physical distress    Intensity  THRR unchanged      Progression   Progression  Continue to progress workloads to maintain intensity without signs/symptoms of physical distress.    Average METs  3.5      Resistance Training   Training Prescription  Yes    Weight  4lbs    Reps  10-15    Time  10 Minutes      Interval Training   Interval Training  No      Recumbant Bike   Level  3.5    Minutes  10    METs  3.2      NuStep   Level  6    SPM  85    Minutes  10    METs  4      Track   Laps  14    Minutes  10    METs  3.43      Home Exercise Plan   Plans to continue exercise at  Home (comment)    Frequency  Add 2 additional days to program exercise sessions.    Initial Home Exercises Provided  12/20/17       Nutrition:  Target Goals: Understanding of nutrition guidelines, daily intake of sodium 1500mg , cholesterol 200mg , calories 30% from fat and 7% or less from saturated fats, daily to have 5 or more servings of fruits and vegetables.  Biometrics: Pre Biometrics - 12/09/17 1101      Pre Biometrics   Height  4\' 11"  (1.499 m)    Weight  64.9 kg    Waist Circumference  36.5 inches    Hip Circumference  39 inches    Waist to Hip Ratio  0.94 %    BMI (Calculated)  28.88    Triceps Skinfold  28 mm    % Body Fat  39 %    Grip Strength  26 kg    Flexibility  13 in    Single Leg Stand  30 seconds        Nutrition Therapy Plan and Nutrition Goals: Nutrition Therapy & Goals - 12/09/17 1152      Nutrition Therapy   Diet  Heart Healthy      Personal Nutrition Goals   Nutrition Goal  Pt to identify food quantities necessary to achieve weight loss of 5-8 lb  at graduation from cardiac rehab. Goal wt of 135 lb desired.       Intervention Plan   Intervention  Prescribe, educate and counsel regarding individualized specific dietary modifications aiming towards targeted core components such as weight, hypertension, lipid management, diabetes, heart failure and other comorbidities.    Expected Outcomes  Short Term Goal: Understand basic principles of dietary content, such as calories, fat, sodium, cholesterol and nutrients.;Long Term Goal: Adherence to prescribed  nutrition plan.       Nutrition Assessments: Nutrition Assessments - 12/09/17 1152      MEDFICTS Scores   Pre Score  30       Nutrition Goals Re-Evaluation:   Nutrition Goals Re-Evaluation:   Nutrition Goals Discharge (Final Nutrition Goals Re-Evaluation):   Psychosocial: Target Goals: Acknowledge presence or absence of significant depression and/or stress, maximize coping skills, provide positive support system. Participant is able to verbalize types and ability to use techniques and skills needed for reducing stress and depression.  Initial Review & Psychosocial Screening: Initial Psych Review & Screening - 12/09/17 0948      Initial Review   Current issues with  Current Stress Concerns    Source of Stress Concerns  Unable to participate in former interests or hobbies    Comments  new baby with unexpected heart failure postpartum complications       Family Dynamics   Good Support System?  Yes      Barriers   Psychosocial barriers to participate in program  There are no identifiable barriers or psychosocial needs.      Screening Interventions   Interventions  Encouraged to exercise       Quality of Life Scores: Quality of Life - 12/09/17 1103      Quality of Life Scores   Health/Function Pre  28 %    Socioeconomic Pre  30 %    Psych/Spiritual Pre  30 %    Family Pre  30 %    GLOBAL Pre  29.14 %      Scores of 19 and below usually indicate a poorer quality of  life in these areas.  A difference of  2-3 points is a clinically meaningful difference.  A difference of 2-3 points in the total score of the Quality of Life Index has been associated with significant improvement in overall quality of life, self-image, physical symptoms, and general health in studies assessing change in quality of life.  PHQ-9: Recent Review Flowsheet Data    Depression screen [facility] 2/9 12/13/2017   Decreased Interest 0   Down, Depressed, Hopeless 0   PHQ - 2 Score 0     Interpretation of Total Score  Total Score Depression Severity:  1-4 = Minimal depression, 5-9 = Mild depression, 10-14 = Moderate depression, 15-19 = Moderately severe depression, 20-27 = Severe depression   Psychosocial Evaluation and Intervention: Psychosocial Evaluation - 12/13/17 1723      Psychosocial Evaluation & Interventions   Interventions  Stress management education;Relaxation education;Encouraged to exercise with the program and follow exercise prescription    Comments  pt with new onset chronic illness as young mother demonstrates health related stress and anxiety. otherwise no psychosocial needs identified, no interventions necessary     Expected Outcomes  pt will exhibit positive outlook with good coping skills.     Continue Psychosocial Services   Follow up required by staff       Psychosocial Re-Evaluation: Psychosocial Re-Evaluation    Row Name 01/06/18 1604 02/03/18 1424           Psychosocial Re-Evaluation   Current issues with  Current Stress Concerns  Current Stress Concerns      Comments  Chrisitna has been feeling better since partcipating in phase 2 cardiac rehab  Chrisitna has been feeling better since partcipating in phase 2 cardiac rehab. Helon has returned to work recently and is doing well      Interventions  Stress management education;Encouraged to attend  Cardiac Rehabilitation for the exercise  Stress management education;Encouraged to attend Cardiac  Rehabilitation for the exercise      Continue Psychosocial Services   Follow up required by staff  Follow up required by staff      Comments  Maronda is feeling better and has started driving again  Torii is doing well and is feeling better physically and emotionally        Initial Review   Source of Stress Concerns  -  Unable to participate in former interests or hobbies         Psychosocial Discharge (Final Psychosocial Re-Evaluation): Psychosocial Re-Evaluation - 02/03/18 1424      Psychosocial Re-Evaluation   Current issues with  Current Stress Concerns    Comments  Chrisitna has been feeling better since partcipating in phase 2 cardiac rehab. Peyten has returned to work recently and is doing well    Interventions  Stress management education;Encouraged to attend Cardiac Rehabilitation for the exercise    Continue Psychosocial Services   Follow up required by staff    Comments  Jouri is doing well and is feeling better physically and emotionally      Initial Review   Source of Stress Concerns  Unable to participate in former interests or hobbies       Vocational Rehabilitation: Provide vocational rehab assistance to qualifying candidates.   Vocational Rehab Evaluation & Intervention: Vocational Rehab - 12/09/17 0947      Initial Vocational Rehab Evaluation & Intervention   Assessment shows need for Vocational Rehabilitation  No       Education: Education Goals: Education classes will be provided on a weekly basis, covering required topics. Participant will state understanding/return demonstration of topics presented.  Learning Barriers/Preferences: Learning Barriers/Preferences - 12/09/17 1057      Learning Barriers/Preferences   Learning Barriers  None    Learning Preferences  Written Material       Education Topics: Count Your Pulse:  -Group instruction provided by verbal instruction, demonstration, patient participation and written materials to  support subject.  Instructors address importance of being able to find your pulse and how to count your pulse when at home without a heart monitor.  Patients get hands on experience counting their pulse with staff help and individually.   CARDIAC REHAB PHASE II EXERCISE from 01/14/2018 in [facility] CARDIAC REHAB  Date  12/24/17  Educator  Lynnda Child  Instruction Review Code  2- Demonstrated Understanding      Heart Attack, Angina, and Risk Factor Modification:  -Group instruction provided by verbal instruction, video, and written materials to support subject.  Instructors address signs and symptoms of angina and heart attacks.    Also discuss risk factors for heart disease and how to make changes to improve heart health risk factors.   CARDIAC REHAB PHASE II EXERCISE from 01/14/2018 in [facility] CARDIAC REHAB  Date  12/15/17  Instruction Review Code  2- Demonstrated Understanding      Functional Fitness:  -Group instruction provided by verbal instruction, demonstration, patient participation, and written materials to support subject.  Instructors address safety measures for doing things around the house.  Discuss how to get up and down off the floor, how to pick things up properly, how to safely get out of a chair without assistance, and balance training.   CARDIAC REHAB PHASE II EXERCISE from 01/14/2018 in [facility] CARDIAC REHAB  Date  01/07/18  Educator  EP  Instruction  Review Code  2- Demonstrated Understanding      Meditation and Mindfulness:  -Group instruction provided by verbal instruction, patient participation, and written materials to support subject.  Instructor addresses importance of mindfulness and meditation practice to help reduce stress and improve awareness.  Instructor also leads participants through a meditation exercise.    Stretching for Flexibility and Mobility:  -Group instruction provided by verbal  instruction, patient participation, and written materials to support subject.  Instructors lead participants through series of stretches that are designed to increase flexibility thus improving mobility.  These stretches are additional exercise for major muscle groups that are typically performed during regular warm up and cool down.   Hands Only CPR:  -Group verbal, video, and participation provides a basic overview of AHA guidelines for community CPR. Role-play of emergencies allow participants the opportunity to practice calling for help and chest compression technique with discussion of AED use.   Hypertension: -Group verbal and written instruction that provides a basic overview of hypertension including the most recent diagnostic guidelines, risk factor reduction with self-care instructions and medication management.   CARDIAC REHAB [facility]  Date  01/14/18  Educator  RN  Instruction Review Code  2- Demonstrated Understanding       Nutrition I class: Heart Healthy Eating:  -Group instruction provided by PowerPoint slides, verbal discussion, and written materials to support subject matter. The instructor gives an explanation and review of the Therapeutic Lifestyle Changes diet recommendations, which includes a discussion on lipid goals, dietary fat, sodium, fiber, plant stanol/sterol esters, sugar, and the components of a well-balanced, healthy diet.   Nutrition II class: Lifestyle Skills:  -Group instruction provided by PowerPoint slides, verbal discussion, and written materials to support subject matter. The instructor gives an explanation and review of label reading, grocery shopping for heart health, heart healthy recipe modifications, and ways to make healthier choices when eating out.   Diabetes Question & Answer:  -Group instruction provided by PowerPoint slides, verbal discussion, and written materials to  support subject matter. The instructor gives an explanation and review of diabetes co-morbidities, pre- and post-prandial blood glucose goals, pre-exercise blood glucose goals, signs, symptoms, and treatment of hypoglycemia and hyperglycemia, and foot care basics.   Diabetes Blitz:  -Group instruction provided by PowerPoint slides, verbal discussion, and written materials to support subject matter. The instructor gives an explanation and review of the physiology behind type 1 and type 2 diabetes, diabetes medications and rational behind using different medications, pre- and post-prandial blood glucose recommendations and Hemoglobin A1c goals, diabetes diet, and exercise including blood glucose guidelines for exercising safely.    Portion Distortion:  -Group instruction provided by PowerPoint slides, verbal discussion, written materials, and food models to support subject matter. The instructor gives an explanation of serving size versus portion size, changes in portions sizes over the last 20 years, and what consists of a serving from each food group.   Stress Management:  -Group instruction provided by verbal instruction, video, and written materials to support subject matter.  Instructors review role of stress in heart disease and how to cope with stress positively.     Exercising on Your Own:  -Group instruction provided by verbal instruction, power point, and written materials to support subject.  Instructors discuss benefits of exercise, components of exercise, frequency and intensity of exercise, and end points for exercise.  Also discuss use of nitroglycerin and activating EMS.  Review options of  places to exercise outside of rehab.  Review guidelines for sex with heart disease.   CARDIAC REHAB PHASE II EXERCISE from 01/14/2018 in [facility] CARDIAC REHAB  Date  12/22/17  Educator  EP  Instruction Review Code  2- Demonstrated Understanding      Cardiac Drugs I:   -Group instruction provided by verbal instruction and written materials to support subject.  Instructor reviews cardiac drug classes: antiplatelets, anticoagulants, beta blockers, and statins.  Instructor discusses reasons, side effects, and lifestyle considerations for each drug class.   Cardiac Drugs II:  -Group instruction provided by verbal instruction and written materials to support subject.  Instructor reviews cardiac drug classes: angiotensin converting enzyme inhibitors (ACE-I), angiotensin II receptor blockers (ARBs), nitrates, and calcium channel blockers.  Instructor discusses reasons, side effects, and lifestyle considerations for each drug class.   Anatomy and Physiology of the Circulatory System:  Group verbal and written instruction and models provide basic cardiac anatomy and physiology, with the coronary electrical and arterial systems. Review of: AMI, Angina, Valve disease, Heart Failure, Peripheral Artery Disease, Cardiac Arrhythmia, Pacemakers, and the ICD.   Other Education:  -Group or individual verbal, written, or video instructions that support the educational goals of the cardiac rehab program.   Holiday Eating Survival Tips:  -Group instruction provided by PowerPoint slides, verbal discussion, and written materials to support subject matter. The instructor gives patients tips, tricks, and techniques to help them not only survive but enjoy the holidays despite the onslaught of food that accompanies the holidays.   Knowledge Questionnaire Score: Knowledge Questionnaire Score - 12/09/17 0947      Knowledge Questionnaire Score   Pre Score  22/24       Core Components/Risk Factors/Patient Goals at Admission: Personal Goals and Risk Factors at Admission - 12/09/17 1049      Core Components/Risk Factors/Patient Goals on Admission    Weight Management  Yes;Weight Loss    Intervention  Weight Management: Develop a combined nutrition and exercise program designed to  reach desired caloric intake, while maintaining appropriate intake of nutrient and fiber, sodium and fats, and appropriate energy expenditure required for the weight goal.;Weight Management: Provide education and appropriate resources to help participant work on and attain dietary goals.;Weight Management/Obesity: Establish reasonable short term and long term weight goals.    Admit Weight  143 lb 1.3 oz (64.9 kg)    Goal Weight: Short Term  136 lb (61.7 kg)    Goal Weight: Long Term  130 lb (59 kg)    Expected Outcomes  Short Term: Continue to assess and modify interventions until short term weight is achieved;Long Term: Adherence to nutrition and physical activity/exercise program aimed toward attainment of established weight goal;Weight Loss: Understanding of general recommendations for a balanced deficit meal plan, which promotes 1-2 lb weight loss per week and includes a negative energy balance of [phone] kcal/d;Understanding recommendations for meals to include 15-35% energy as protein, 25-35% energy from fat, 35-60% energy from carbohydrates, less than 200mg  of dietary cholesterol, 20-35 gm of total fiber daily;Understanding of distribution of calorie intake throughout the day with the consumption of 4-5 meals/snacks    Heart Failure  Yes    Intervention  Provide a combined exercise and nutrition program that is supplemented with education, support and counseling about heart failure. Directed toward relieving symptoms such as shortness of breath, decreased exercise tolerance, and extremity edema.    Expected Outcomes  Improve functional capacity of life;Long term: Adoption of self-care skills and  reduction of barriers for early signs and symptoms recognition and intervention leading to self-care maintenance.;Short term: Daily weights obtained and reported for increase. Utilizing diuretic protocols set by physician.;Short term: Attendance in program 2-3 days a week with increased exercise capacity.  Reported lower sodium intake. Reported increased fruit and vegetable intake. Reports medication compliance.       Core Components/Risk Factors/Patient Goals Review:  Goals and Risk Factor Review    Row Name 12/13/17 1721 01/06/18 1603 02/03/18 1426         Core Components/Risk Factors/Patient Goals Review   Personal Goals Review  Weight Management/Obesity;Heart Failure  Weight Management/Obesity;Heart Failure  Weight Management/Obesity;Heart Failure     Review  pt demonstrates eagerness to lose weight and learn more about improving heart function.  pt eager to increase strength/stamina to be able to resume carrying her 2 yo child.   pt demonstrates eagerness to lose weight and learn more about improving heart function.  pt eager to increase strength/stamina to be able to resume carrying her 2 yo child.   Eshaal is doing great with exercise. Sherylann's vital signs have been stable. Latrease has lost 1.7 kg since starting cardiac rehab.     Expected Outcomes  pt will participate in CR exercise, nutrition and lifestyle modification education opportunities to decrease overall RF.   pt will participate in CR exercise, nutrition and lifestyle modification education opportunities to decrease overall RF.   pt will participate in CR exercise, nutrition and lifestyle modification education opportunities to decrease overall RF.         Core Components/Risk Factors/Patient Goals at Discharge (Final Review):  Goals and Risk Factor Review - 02/03/18 1426      Core Components/Risk Factors/Patient Goals Review   Personal Goals Review  Weight Management/Obesity;Heart Failure    Review  Shamaya is doing great with exercise. Lajoya's vital signs have been stable. Kimani has lost 1.7 kg since starting cardiac rehab.    Expected Outcomes  pt will participate in CR exercise, nutrition and lifestyle modification education opportunities to decrease overall RF.        ITP Comments: ITP Comments     Row Name 12/09/17 0819 12/13/17 1720 01/06/18 1602 02/03/18 1423     ITP Comments  Dr. Armanda Magic, Medical Director   pt started group exercise sessions. pt tolerated light activity without difficulty. pt oriented to exercise equipment and safety routine.   30 day ITP Review. Patient with good participation and attendance at phase 2 cardiac rehab.  30 day ITP Review. Patient with good participation and attendance at phase 2 cardiac rehab.       Comments: See ITP comments.Thayer Headings RN BSN

## 2018-02-04 ENCOUNTER — Encounter (HOSPITAL_COMMUNITY)
Admission: RE | Admit: 2018-02-04 | Discharge: 2018-02-04 | Disposition: A | Discharge status: Home / Self Care | Payer: BLUE CROSS/BLUE SHIELD | Source: Ambulatory Visit | Attending: Cardiology | Admitting: Cardiology

## 2018-02-04 DIAGNOSIS — I110 Hypertensive heart disease with heart failure: Secondary | ICD-10-CM | POA: Diagnosis not present

## 2018-02-04 DIAGNOSIS — I5022 Chronic systolic (congestive) heart failure: Secondary | ICD-10-CM

## 2018-02-07 ENCOUNTER — Encounter (HOSPITAL_COMMUNITY)
Admission: RE | Admit: 2018-02-07 | Discharge: 2018-02-07 | Disposition: A | Discharge status: Home / Self Care | Payer: BLUE CROSS/BLUE SHIELD | Source: Ambulatory Visit | Attending: Cardiology | Admitting: Cardiology

## 2018-02-07 DIAGNOSIS — I110 Hypertensive heart disease with heart failure: Secondary | ICD-10-CM | POA: Diagnosis not present

## 2018-02-07 DIAGNOSIS — I5022 Chronic systolic (congestive) heart failure: Secondary | ICD-10-CM

## 2018-02-09 ENCOUNTER — Encounter (HOSPITAL_COMMUNITY)
Admission: RE | Admit: 2018-02-09 | Discharge: 2018-02-09 | Disposition: A | Discharge status: Home / Self Care | Payer: BLUE CROSS/BLUE SHIELD | Source: Ambulatory Visit | Attending: Cardiology | Admitting: Cardiology

## 2018-02-09 DIAGNOSIS — I5022 Chronic systolic (congestive) heart failure: Secondary | ICD-10-CM

## 2018-02-09 DIAGNOSIS — I110 Hypertensive heart disease with heart failure: Secondary | ICD-10-CM | POA: Diagnosis not present

## 2018-02-11 ENCOUNTER — Encounter (HOSPITAL_COMMUNITY)
Admission: RE | Admit: 2018-02-11 | Discharge: 2018-02-11 | Disposition: A | Discharge status: Home / Self Care | Payer: BLUE CROSS/BLUE SHIELD | Source: Ambulatory Visit | Attending: Cardiology | Admitting: Cardiology

## 2018-02-11 DIAGNOSIS — I110 Hypertensive heart disease with heart failure: Secondary | ICD-10-CM | POA: Diagnosis not present

## 2018-02-11 DIAGNOSIS — I5022 Chronic systolic (congestive) heart failure: Secondary | ICD-10-CM

## 2018-02-14 ENCOUNTER — Encounter (HOSPITAL_COMMUNITY)
Admission: RE | Admit: 2018-02-14 | Discharge: 2018-02-14 | Disposition: A | Discharge status: Home / Self Care | Payer: BLUE CROSS/BLUE SHIELD | Source: Ambulatory Visit | Attending: Cardiology | Admitting: Cardiology

## 2018-02-14 DIAGNOSIS — I110 Hypertensive heart disease with heart failure: Secondary | ICD-10-CM | POA: Diagnosis not present

## 2018-02-16 ENCOUNTER — Encounter (HOSPITAL_COMMUNITY)
Admission: RE | Admit: 2018-02-16 | Discharge: 2018-02-16 | Disposition: A | Discharge status: Home / Self Care | Payer: BLUE CROSS/BLUE SHIELD | Source: Ambulatory Visit | Attending: Cardiology | Admitting: Cardiology

## 2018-02-16 DIAGNOSIS — I110 Hypertensive heart disease with heart failure: Secondary | ICD-10-CM | POA: Diagnosis not present

## 2018-02-16 DIAGNOSIS — I5022 Chronic systolic (congestive) heart failure: Secondary | ICD-10-CM

## 2018-02-18 ENCOUNTER — Encounter (HOSPITAL_COMMUNITY)
Admission: RE | Admit: 2018-02-18 | Discharge: 2018-02-18 | Disposition: A | Discharge status: Home / Self Care | Payer: BLUE CROSS/BLUE SHIELD | Source: Ambulatory Visit | Attending: Cardiology | Admitting: Cardiology

## 2018-02-18 DIAGNOSIS — I5022 Chronic systolic (congestive) heart failure: Secondary | ICD-10-CM

## 2018-02-18 DIAGNOSIS — I110 Hypertensive heart disease with heart failure: Secondary | ICD-10-CM | POA: Diagnosis not present

## 2018-02-21 ENCOUNTER — Encounter (HOSPITAL_COMMUNITY)
Admission: RE | Admit: 2018-02-21 | Discharge: 2018-02-21 | Disposition: A | Discharge status: Home / Self Care | Payer: BLUE CROSS/BLUE SHIELD | Source: Ambulatory Visit | Attending: Cardiology | Admitting: Cardiology

## 2018-02-21 DIAGNOSIS — I5022 Chronic systolic (congestive) heart failure: Secondary | ICD-10-CM

## 2018-02-21 DIAGNOSIS — I110 Hypertensive heart disease with heart failure: Secondary | ICD-10-CM | POA: Diagnosis not present

## 2018-02-23 ENCOUNTER — Encounter (HOSPITAL_COMMUNITY)
Admission: RE | Admit: 2018-02-23 | Discharge: 2018-02-23 | Disposition: A | Discharge status: Home / Self Care | Payer: BLUE CROSS/BLUE SHIELD | Source: Ambulatory Visit | Attending: Cardiology | Admitting: Cardiology

## 2018-02-23 DIAGNOSIS — I110 Hypertensive heart disease with heart failure: Secondary | ICD-10-CM | POA: Diagnosis not present

## 2018-02-23 DIAGNOSIS — I5022 Chronic systolic (congestive) heart failure: Secondary | ICD-10-CM

## 2018-02-25 ENCOUNTER — Encounter (HOSPITAL_COMMUNITY): Payer: Self-pay

## 2018-02-25 ENCOUNTER — Encounter (HOSPITAL_COMMUNITY): Payer: BLUE CROSS/BLUE SHIELD

## 2018-03-02 ENCOUNTER — Encounter (HOSPITAL_COMMUNITY)
Admission: RE | Admit: 2018-03-02 | Discharge: 2018-03-02 | Disposition: A | Discharge status: Home / Self Care | Payer: BLUE CROSS/BLUE SHIELD | Source: Ambulatory Visit | Attending: Cardiology | Admitting: Cardiology

## 2018-03-02 DIAGNOSIS — I5022 Chronic systolic (congestive) heart failure: Secondary | ICD-10-CM | POA: Diagnosis present

## 2018-03-02 DIAGNOSIS — Z87891 Personal history of nicotine dependence: Secondary | ICD-10-CM | POA: Diagnosis not present

## 2018-03-02 DIAGNOSIS — D569 Thalassemia, unspecified: Secondary | ICD-10-CM | POA: Diagnosis not present

## 2018-03-02 DIAGNOSIS — I110 Hypertensive heart disease with heart failure: Secondary | ICD-10-CM | POA: Diagnosis not present

## 2018-03-02 DIAGNOSIS — Z79899 Other long term (current) drug therapy: Secondary | ICD-10-CM | POA: Insufficient documentation

## 2018-03-02 NOTE — Progress Notes (Signed)
Cardiac Individual Treatment Plan  Patient Details  Name: Melinda Allen MRN: [id number] Date of Birth: [birth year] Referring Provider:     CARDIAC REHAB PHASE II ORIENTATION from 12/09/2017 in [facility] CARDIAC REHAB  Referring Provider  Quintella Reichert MD       Initial Encounter Date:    CARDIAC REHAB PHASE II ORIENTATION from 12/09/2017 in [facility] CARDIAC REHAB  Date  12/09/17      Visit Diagnosis: Chronic systolic congestive heart failure (HCC)  Patient's Home Medications on Admission:  Current Outpatient Medications:  .  digoxin (LANOXIN) 0.125 MG tablet, Take 0.125 mg by mouth daily., Disp: , Rfl:  .  ferrous sulfate 325 (65 FE) MG tablet, Take 325 mg by mouth at bedtime. , Disp: , Rfl:  .  Fluticasone Furoate 50 MCG/ACT AEPB, Inhale 1 spray into the lungs daily as needed., Disp: , Rfl:  .  furosemide (LASIX) 20 MG tablet, Take 20 mg by mouth., Disp: , Rfl:  .  loratadine (CLARITIN) 10 MG tablet, Take 10 mg by mouth daily as needed for allergies., Disp: , Rfl:  .  Melatonin 1 MG CAPS, Take 1 capsule by mouth at bedtime as needed., Disp: , Rfl:  .  metoprolol succinate (TOPROL-XL) 25 MG 24 hr tablet, Take 25 mg by mouth daily., Disp: , Rfl:  .  Prenatal Vit-Fe Fumarate-FA (MULTIVITAMIN-PRENATAL) 27-0.8 MG TABS tablet, Take 1 tablet by mouth at bedtime. , Disp: , Rfl:  .  spironolactone (ALDACTONE) 25 MG tablet, Take 25 mg by mouth daily., Disp: , Rfl:  .  valsartan (DIOVAN) 160 MG tablet, Take 160 mg by mouth 2 (two) times daily., Disp: , Rfl:   Past Medical History: Past Medical History:  Diagnosis Date  . Hypertension   . Thalassemia, unspecified     Tobacco Use: Social History   Tobacco Use  Smoking Status Former Smoker  . Last attempt to quit: 03/01/2014  . Years since quitting: 4.0  Smokeless Tobacco Never Used    Labs: Recent Review Flowsheet Data    There is no flowsheet data to display.      Capillary Blood  Glucose: No results found for: GLUCAP   Exercise Target Goals: Exercise Program Goal: Individual exercise prescription set using results from initial 6 min walk test and THRR while considering  patient's activity barriers and safety.   Exercise Prescription Goal: Initial exercise prescription builds to 30-45 minutes a day of aerobic activity, 2-3 days per week.  Home exercise guidelines will be given to patient during program as part of exercise prescription that the participant will acknowledge.  Activity Barriers & Risk Stratification: Activity Barriers & Cardiac Risk Stratification - 12/09/17 0854      Activity Barriers & Cardiac Risk Stratification   Activity Barriers  None    Cardiac Risk Stratification  High       6 Minute Walk: 6 Minute Walk    Row Name 12/09/17 1059         6 Minute Walk   Phase  Initial     Distance  1552 feet     Walk Time  6 minutes     # of Rest Breaks  0     MPH  2.94     METS  4.63     RPE  9     Perceived Dyspnea   0     VO2 Peak  16.19     Symptoms  No  Resting HR  82 bpm     Resting BP  102/40     Resting Oxygen Saturation   99 %     Exercise Oxygen Saturation  during 6 min walk  100 %     Max Ex. HR  110 bpm     Max Ex. BP  104/72     2 Minute Post BP  94/64        Oxygen Initial Assessment:   Oxygen Re-Evaluation:   Oxygen Discharge (Final Oxygen Re-Evaluation):   Initial Exercise Prescription: Initial Exercise Prescription - 12/09/17 1100      Date of Initial Exercise RX and Referring Provider   Date  12/09/17    Referring Provider  Quintella Reichert MD       Recumbant Bike   Level  2   SciFit   Watts  45    Minutes  10    METs  4.18      NuStep   Level  2    SPM  75    Minutes  10    METs  3.5      Track   Laps  13    Minutes  10    METs  3.3      Prescription Details   Frequency (times per week)  3x    Duration  Progress to 30 minutes of continuous aerobic without signs/symptoms of physical  distress      Intensity   THRR 40-80% of Max Heartrate  72-144    Ratings of Perceived Exertion  11-13    Perceived Dyspnea  0-4      Progression   Progression  Continue progressive overload as per policy without signs/symptoms or physical distress.      Resistance Training   Training Prescription  Yes    Weight  3lbs    Reps  10-15       Perform Capillary Blood Glucose checks as needed.  Exercise Prescription Changes: Exercise Prescription Changes    Row Name 12/13/17 1454 12/27/17 1449 01/10/18 1449 01/24/18 1449 02/07/18 1450     Response to Exercise   Blood Pressure (Admit)  100/64  104/60  108/60  110/58  96/68   Blood Pressure (Exercise)  130/68  136/70  134/64  122/78  102/70   Blood Pressure (Exit)  104/70  98/68  92/60  93/61  92/52   Heart Rate (Admit)  97 bpm  93 bpm  82 bpm  84 bpm  80 bpm   Heart Rate (Exercise)  136 bpm  156 bpm  150 bpm  152 bpm  152 bpm   Heart Rate (Exit)  70 bpm  93 bpm  81 bpm  84 bpm  79 bpm   Rating of Perceived Exertion (Exercise)  11  12  13  12  12    Symptoms  none  none  none  none  none   Duration  Progress to 30 minutes of  aerobic without signs/symptoms of physical distress  Progress to 30 minutes of  aerobic without signs/symptoms of physical distress  Progress to 30 minutes of  aerobic without signs/symptoms of physical distress  Progress to 30 minutes of  aerobic without signs/symptoms of physical distress  Progress to 30 minutes of  aerobic without signs/symptoms of physical distress   Intensity  THRR unchanged  THRR unchanged  THRR unchanged  THRR unchanged  THRR unchanged     Progression   Progression  Continue to progress workloads to  maintain intensity without signs/symptoms of physical distress.  Continue to progress workloads to maintain intensity without signs/symptoms of physical distress.  Continue to progress workloads to maintain intensity without signs/symptoms of physical distress.  Continue to progress workloads to  maintain intensity without signs/symptoms of physical distress.  Continue to progress workloads to maintain intensity without signs/symptoms of physical distress.   Average METs  3.1  3.2  3.3  3.5  4.6     Resistance Training   Training Prescription  Yes  Yes  Yes  Yes  Yes   Weight  3lbs  3lbs  4lbs  4lbs  4lbs   Reps  10-15  10-15  10-15  10-15  10-15   Time  10 Minutes  10 Minutes  10 Minutes  10 Minutes  10 Minutes     Interval Training   Interval Training  No  No  No  No  No     Bike   Level  1  -  -  -  -   Minutes  10  -  -  -  -   METs  3.91  -  -  -  -     Recumbant Bike   Level  -  3  3  3.5  -   Watts  -  -  -  -  -   Minutes  -  10  10  10   -   METs  -  3.3  3.2  3.2  -     NuStep   Level  2  4  5  6  6    SPM  75  85  85  85  85   Minutes  10  10  10  10  10    METs  2.4  3  3.5  4  4.5     Rower   Watts  -  -  -  -  40   Minutes  -  -  -  -  10   METs  -  -  -  -  5.7     Track   Laps  12  13  13  14  15    Minutes  10  10  10  10  10    METs  3.09  3.26  3.26  3.43  3.61     Home Exercise Plan   Plans to continue exercise at  -  Home (comment)  Home (comment)  Home (comment)  Home (comment)   Frequency  -  Add 2 additional days to program exercise sessions.  Add 2 additional days to program exercise sessions.  Add 2 additional days to program exercise sessions.  Add 2 additional days to program exercise sessions.   Initial Home Exercises Provided  -  12/20/17  12/20/17  12/20/17  12/20/17   Row Name 02/21/18 1447             Response to Exercise   Blood Pressure (Admit)  98/60       Blood Pressure (Exercise)  130/60       Blood Pressure (Exit)  99/63       Heart Rate (Admit)  85 bpm       Heart Rate (Exercise)  165 bpm       Heart Rate (Exit)  73 bpm       Rating of Perceived Exertion (Exercise)  12       Symptoms  none  Duration  Progress to 30 minutes of  aerobic without signs/symptoms of physical distress       Intensity  THRR unchanged          Progression   Progression  Continue to progress workloads to maintain intensity without signs/symptoms of physical distress.       Average METs  5         Resistance Training   Training Prescription  Yes       Weight  5lbs       Reps  10-15       Time  10 Minutes         Interval Training   Interval Training  No         NuStep   Level  6       SPM  85       Minutes  10       METs  5         Rower   Level  4       Watts  50       Minutes  10       METs  6.25         Track   Laps  16       Minutes  10       METs  3.78         Home Exercise Plan   Plans to continue exercise at  Home (comment)       Frequency  Add 2 additional days to program exercise sessions.       Initial Home Exercises Provided  12/20/17          Exercise Comments: Exercise Comments    Row Name 12/13/17 1533 12/20/17 1530 12/27/17 1542 01/10/18 1514 01/24/18 1502   Exercise Comments  Reviewed METs with patient.  Reviewed home exercise guidelines, METs, and goals with patient.  Reviewed METs and goals with patient.  Reviewed METs with patient.  Reviewed METs and goals with patient.   Row Name 02/02/18 1445 02/07/18 1516 02/23/18 1515       Exercise Comments  Received clearance from cardiologist to increase THRR to 171 bpm.  Reviewed METs with patient.  Reviewed METs and goals with patient.        Exercise Goals and Review: Exercise Goals    Row Name 12/09/17 1100             Exercise Goals   Increase Physical Activity  Yes       Intervention  Provide advice, education, support and counseling about physical activity/exercise needs.;Develop an individualized exercise prescription for aerobic and resistive training based on initial evaluation findings, risk stratification, comorbidities and participant's personal goals.       Expected Outcomes  Short Term: Attend rehab on a regular basis to increase amount of physical activity.;Long Term: Add in home exercise to make exercise part of  routine and to increase amount of physical activity.;Long Term: Exercising regularly at least 3-5 days a week.       Increase Strength and Stamina  Yes       Intervention  Provide advice, education, support and counseling about physical activity/exercise needs.;Develop an individualized exercise prescription for aerobic and resistive training based on initial evaluation findings, risk stratification, comorbidities and participant's personal goals.       Expected Outcomes  Short Term: Increase workloads from initial exercise prescription for resistance, speed, and METs.;Short Term: Perform resistance training exercises routinely  during rehab and add in resistance training at home;Long Term: Improve cardiorespiratory fitness, muscular endurance and strength as measured by increased METs and functional capacity ( )       Able to understand and use rate of perceived exertion (RPE) scale  Yes       Intervention  Provide education and explanation on how to use RPE scale       Expected Outcomes  Short Term: Able to use RPE daily in rehab to express subjective intensity level;Long Term:  Able to use RPE to guide intensity level when exercising independently       Knowledge and understanding of Target Heart Rate Range (THRR)  Yes       Intervention  Provide education and explanation of THRR including how the numbers were predicted and where they are located for reference       Expected Outcomes  Short Term: Able to state/look up THRR;Long Term: Able to use THRR to govern intensity when exercising independently;Short Term: Able to use daily as guideline for intensity in rehab       Able to check pulse independently  Yes       Intervention  Provide education and demonstration on how to check pulse in carotid and radial arteries.;Review the importance of being able to check your own pulse for safety during independent exercise       Expected Outcomes  Short Term: Able to explain why pulse checking is important  during independent exercise;Long Term: Able to check pulse independently and accurately       Understanding of Exercise Prescription  Yes       Intervention  Provide education, explanation, and written materials on patient's individual exercise prescription       Expected Outcomes  Short Term: Able to explain program exercise prescription;Long Term: Able to explain home exercise prescription to exercise independently          Exercise Goals Re-Evaluation : Exercise Goals Re-Evaluation    Row Name 12/20/17 1530 12/27/17 1542 01/24/18 1449 02/23/18 1515       Exercise Goal Re-Evaluation   Exercise Goals Review  Understanding of Exercise Prescription;Knowledge and understanding of Target Heart Rate Range (THRR);Able to understand and use rate of perceived exertion (RPE) scale  Increase Strength and Stamina  Increase Strength and Stamina  Increase Strength and Stamina;Understanding of Exercise Prescription    Comments  Reviewed home exercise guidelines with patient including THRR, RPE scale and endpoints for exercise.  Patient is walking 40-45 minutes at least 2 days/week in addition to exercise at CR.  Patient is continuing walking routine at home in addition to exercise at CR. Pt returns to work on 01/26/18. Pt would like to increase upper body strength, therefore will switch from recumbent bike to rower.  Patient is making excellent progress with exercise. Per patient her strength has improved and she's able to carry 2 jugs of milk, while prior to starting the program she couldn't lift 1. Pt has been able to carry her son around, and pt states that her EF has improved. Pt plans to join a fitness center and do 30 minutes of cardio at least 3 days/week once she completes CR. Pt's functional capacity improved 30% as measured by the .    Expected Outcomes  Patient will walk 30-45 minutes, 2 days/week in addition to exercise at CR to help improve strength and stamina.  Continue to progress workloads to  help improve strength and stamina, increase hand weights as tolerated to help  improve upper body strength.  Patient will switch to rower to help increase upper body strength in addition to increasing hand weights.  Patient will continue exercise routine at local fitness center once CR is complete to maintain health and fitness gains.       Discharge Exercise Prescription (Final Exercise Prescription Changes): Exercise Prescription Changes - 02/21/18 1447      Response to Exercise   Blood Pressure (Admit)  98/60    Blood Pressure (Exercise)  130/60    Blood Pressure (Exit)  99/63    Heart Rate (Admit)  85 bpm    Heart Rate (Exercise)  165 bpm    Heart Rate (Exit)  73 bpm    Rating of Perceived Exertion (Exercise)  12    Symptoms  none    Duration  Progress to 30 minutes of  aerobic without signs/symptoms of physical distress    Intensity  THRR unchanged      Progression   Progression  Continue to progress workloads to maintain intensity without signs/symptoms of physical distress.    Average METs  5      Resistance Training   Training Prescription  Yes    Weight  5lbs    Reps  10-15    Time  10 Minutes      Interval Training   Interval Training  No      NuStep   Level  6    SPM  85    Minutes  10    METs  5      Rower   Level  4    Watts  50    Minutes  10    METs  6.25      Track   Laps  16    Minutes  10    METs  3.78      Home Exercise Plan   Plans to continue exercise at  Home (comment)    Frequency  Add 2 additional days to program exercise sessions.    Initial Home Exercises Provided  12/20/17       Nutrition:  Target Goals: Understanding of nutrition guidelines, daily intake of sodium 1500mg , cholesterol 200mg , calories 30% from fat and 7% or less from saturated fats, daily to have 5 or more servings of fruits and vegetables.  Biometrics: Pre Biometrics - 12/09/17 1101      Pre Biometrics   Height  4\' 11"  (1.499 m)    Weight  64.9 kg    Waist  Circumference  36.5 inches    Hip Circumference  39 inches    Waist to Hip Ratio  0.94 %    BMI (Calculated)  28.88    Triceps Skinfold  28 mm    % Body Fat  39 %    Grip Strength  26 kg    Flexibility  13 in    Single Leg Stand  30 seconds        Nutrition Therapy Plan and Nutrition Goals: Nutrition Therapy & Goals - 12/09/17 1152      Nutrition Therapy   Diet  Heart Healthy      Personal Nutrition Goals   Nutrition Goal  Pt to identify food quantities necessary to achieve weight loss of 5-8 lb at graduation from cardiac rehab. Goal wt of 135 lb desired.       Intervention Plan   Intervention  Prescribe, educate and counsel regarding individualized specific dietary modifications aiming towards targeted core components such as weight,  hypertension, lipid management, diabetes, heart failure and other comorbidities.    Expected Outcomes  Short Term Goal: Understand basic principles of dietary content, such as calories, fat, sodium, cholesterol and nutrients.;Long Term Goal: Adherence to prescribed nutrition plan.       Nutrition Assessments: Nutrition Assessments - 12/09/17 1152      MEDFICTS Scores   Pre Score  30       Nutrition Goals Re-Evaluation:   Nutrition Goals Re-Evaluation:   Nutrition Goals Discharge (Final Nutrition Goals Re-Evaluation):   Psychosocial: Target Goals: Acknowledge presence or absence of significant depression and/or stress, maximize coping skills, provide positive support system. Participant is able to verbalize types and ability to use techniques and skills needed for reducing stress and depression.  Initial Review & Psychosocial Screening: Initial Psych Review & Screening - 12/09/17 0948      Initial Review   Current issues with  Current Stress Concerns    Source of Stress Concerns  Unable to participate in former interests or hobbies    Comments  new baby with unexpected heart failure postpartum complications       Family Dynamics    Good Support System?  Yes   spouse, parents, in laws      Barriers   Psychosocial barriers to participate in program  There are no identifiable barriers or psychosocial needs.      Screening Interventions   Interventions  Encouraged to exercise       Quality of Life Scores: Quality of Life - 12/09/17 1103      Quality of Life Scores   Health/Function Pre  28 %    Socioeconomic Pre  30 %    Psych/Spiritual Pre  30 %    Family Pre  30 %    GLOBAL Pre  29.14 %      Scores of 19 and below usually indicate a poorer quality of life in these areas.  A difference of  2-3 points is a clinically meaningful difference.  A difference of 2-3 points in the total score of the Quality of Life Index has been associated with significant improvement in overall quality of life, self-image, physical symptoms, and general health in studies assessing change in quality of life.  PHQ-9: Recent Review Flowsheet Data    Depression screen [facility] 2/9 12/13/2017   Decreased Interest 0   Down, Depressed, Hopeless 0   PHQ - 2 Score 0     Interpretation of Total Score  Total Score Depression Severity:  1-4 = Minimal depression, 5-9 = Mild depression, 10-14 = Moderate depression, 15-19 = Moderately severe depression, 20-27 = Severe depression   Psychosocial Evaluation and Intervention: Psychosocial Evaluation - 12/13/17 1723      Psychosocial Evaluation & Interventions   Interventions  Stress management education;Relaxation education;Encouraged to exercise with the program and follow exercise prescription    Comments  pt with new onset chronic illness as young mother demonstrates health related stress and anxiety. otherwise no psychosocial needs identified, no interventions necessary     Expected Outcomes  pt will exhibit positive outlook with good coping skills.     Continue Psychosocial Services   Follow up required by staff       Psychosocial Re-Evaluation: Psychosocial Re-Evaluation    Row Name  01/06/18 1604 02/03/18 1424 03/02/18 1706         Psychosocial Re-Evaluation   Current issues with  Current Stress Concerns  Current Stress Concerns  Current Stress Concerns     Comments  Chrisitna has been feeling better since partcipating in phase 2 cardiac rehab  Chrisitna has been feeling better since partcipating in phase 2 cardiac rehab. Greysen has returned to work recently and is doing well  Chrisitna has been feeling better since partcipating in phase 2 cardiac rehab. Ruthy has returned to work recently and is doing well     Interventions  Stress management education;Encouraged to attend Cardiac Rehabilitation for the exercise  Stress management education;Encouraged to attend Cardiac Rehabilitation for the exercise  Stress management education;Encouraged to attend Cardiac Rehabilitation for the exercise     Continue Psychosocial Services   Follow up required by staff  Follow up required by staff  No Follow up required     Comments  Brihana is feeling better and has started driving again  Agueda is doing well and is feeling better physically and emotionally  Kynslie is doing well and is feeling better physically and emotionally. Hannan's health stressors are better. Lacara is managing her family life, work and continues to particiapte in phase 2 cardiac rehab       Initial Review   Source of Stress Concerns  -  Unable to participate in former interests or hobbies  -        Psychosocial Discharge (Final Psychosocial Re-Evaluation): Psychosocial Re-Evaluation - 03/02/18 1706      Psychosocial Re-Evaluation   Current issues with  Current Stress Concerns    Comments  Chrisitna has been feeling better since partcipating in phase 2 cardiac rehab. Sowmya has returned to work recently and is doing well    Interventions  Stress management education;Encouraged to attend Cardiac Rehabilitation for the exercise    Continue Psychosocial Services   No Follow up required     Comments  Ahmira is doing well and is feeling better physically and emotionally. Kimberle's health stressors are better. Leeasia is managing her family life, work and continues to particiapte in phase 2 cardiac rehab       Vocational Rehabilitation: Provide vocational rehab assistance to qualifying candidates.   Vocational Rehab Evaluation & Intervention: Vocational Rehab - 12/09/17 0947      Initial Vocational Rehab Evaluation & Intervention   Assessment shows need for Vocational Rehabilitation  No       Education: Education Goals: Education classes will be provided on a weekly basis, covering required topics. Participant will state understanding/return demonstration of topics presented.  Learning Barriers/Preferences: Learning Barriers/Preferences - 12/09/17 1057      Learning Barriers/Preferences   Learning Barriers  None    Learning Preferences  Written Material       Education Topics: Count Your Pulse:  -Group instruction provided by verbal instruction, demonstration, patient participation and written materials to support subject.  Instructors address importance of being able to find your pulse and how to count your pulse when at home without a heart monitor.  Patients get hands on experience counting their pulse with staff help and individually.   CARDIAC REHAB PHASE II EXERCISE from 01/14/2018 in [facility] CARDIAC REHAB  Date  12/24/17  Educator  Lynnda Child  Instruction Review Code  2- Demonstrated Understanding      Heart Attack, Angina, and Risk Factor Modification:  -Group instruction provided by verbal instruction, video, and written materials to support subject.  Instructors address signs and symptoms of angina and heart attacks.    Also discuss risk factors for heart disease and how to make changes to improve heart health risk factors.   CARDIAC REHAB PHASE  II EXERCISE from 01/14/2018 in [facility] CARDIAC REHAB  Date   12/15/17  Instruction Review Code  2- Demonstrated Understanding      Functional Fitness:  -Group instruction provided by verbal instruction, demonstration, patient participation, and written materials to support subject.  Instructors address safety measures for doing things around the house.  Discuss how to get up and down off the floor, how to pick things up properly, how to safely get out of a chair without assistance, and balance training.   CARDIAC REHAB PHASE II EXERCISE from 01/14/2018 in [facility] CARDIAC REHAB  Date  01/07/18  Educator  EP  Instruction Review Code  2- Demonstrated Understanding      Meditation and Mindfulness:  -Group instruction provided by verbal instruction, patient participation, and written materials to support subject.  Instructor addresses importance of mindfulness and meditation practice to help reduce stress and improve awareness.  Instructor also leads participants through a meditation exercise.    Stretching for Flexibility and Mobility:  -Group instruction provided by verbal instruction, patient participation, and written materials to support subject.  Instructors lead participants through series of stretches that are designed to increase flexibility thus improving mobility.  These stretches are additional exercise for major muscle groups that are typically performed during regular warm up and cool down.   Hands Only CPR:  -Group verbal, video, and participation provides a basic overview of AHA guidelines for community CPR. Role-play of emergencies allow participants the opportunity to practice calling for help and chest compression technique with discussion of AED use.   Hypertension: -Group verbal and written instruction that provides a basic overview of hypertension including the most recent diagnostic guidelines, risk factor reduction with self-care instructions and medication management.   CARDIAC REHAB PHASE II EXERCISE from  01/14/2018 in [facility] CARDIAC REHAB  Date  01/14/18  Educator  RN  Instruction Review Code  2- Demonstrated Understanding       Nutrition I class: Heart Healthy Eating:  -Group instruction provided by PowerPoint slides, verbal discussion, and written materials to support subject matter. The instructor gives an explanation and review of the Therapeutic Lifestyle Changes diet recommendations, which includes a discussion on lipid goals, dietary fat, sodium, fiber, plant stanol/sterol esters, sugar, and the components of a well-balanced, healthy diet.   Nutrition II class: Lifestyle Skills:  -Group instruction provided by PowerPoint slides, verbal discussion, and written materials to support subject matter. The instructor gives an explanation and review of label reading, grocery shopping for heart health, heart healthy recipe modifications, and ways to make healthier choices when eating out.   Diabetes Question & Answer:  -Group instruction provided by PowerPoint slides, verbal discussion, and written materials to support subject matter. The instructor gives an explanation and review of diabetes co-morbidities, pre- and post-prandial blood glucose goals, pre-exercise blood glucose goals, signs, symptoms, and treatment of hypoglycemia and hyperglycemia, and foot care basics.   Diabetes Blitz:  -Group instruction provided by PowerPoint slides, verbal discussion, and written materials to support subject matter. The instructor gives an explanation and review of the physiology behind type 1 and type 2 diabetes, diabetes medications and rational behind using different medications, pre- and post-prandial blood glucose recommendations and Hemoglobin A1c goals, diabetes diet, and exercise including blood glucose guidelines for exercising safely.    Portion Distortion:  -Group instruction provided by PowerPoint slides, verbal discussion, written materials, and food models to support  subject matter. The instructor gives an explanation of  serving size versus portion size, changes in portions sizes over the last 20 years, and what consists of a serving from each food group.   Stress Management:  -Group instruction provided by verbal instruction, video, and written materials to support subject matter.  Instructors review role of stress in heart disease and how to cope with stress positively.     Exercising on Your Own:  -Group instruction provided by verbal instruction, power point, and written materials to support subject.  Instructors discuss benefits of exercise, components of exercise, frequency and intensity of exercise, and end points for exercise.  Also discuss use of nitroglycerin and activating EMS.  Review options of places to exercise outside of rehab.  Review guidelines for sex with heart disease.   CARDIAC REHAB PHASE II EXERCISE from 01/14/2018 in [facility] CARDIAC REHAB  Date  12/22/17  Educator  EP  Instruction Review Code  2- Demonstrated Understanding      Cardiac Drugs I:  -Group instruction provided by verbal instruction and written materials to support subject.  Instructor reviews cardiac drug classes: antiplatelets, anticoagulants, beta blockers, and statins.  Instructor discusses reasons, side effects, and lifestyle considerations for each drug class.   Cardiac Drugs II:  -Group instruction provided by verbal instruction and written materials to support subject.  Instructor reviews cardiac drug classes: angiotensin converting enzyme inhibitors (ACE-I), angiotensin II receptor blockers (ARBs), nitrates, and calcium channel blockers.  Instructor discusses reasons, side effects, and lifestyle considerations for each drug class.   Anatomy and Physiology of the Circulatory System:  Group verbal and written instruction and models provide basic cardiac anatomy and physiology, with the coronary electrical and arterial systems. Review of:  AMI, Angina, Valve disease, Heart Failure, Peripheral Artery Disease, Cardiac Arrhythmia, Pacemakers, and the ICD.   Other Education:  -Group or individual verbal, written, or video instructions that support the educational goals of the cardiac rehab program.   Holiday Eating Survival Tips:  -Group instruction provided by PowerPoint slides, verbal discussion, and written materials to support subject matter. The instructor gives patients tips, tricks, and techniques to help them not only survive but enjoy the holidays despite the onslaught of food that accompanies the holidays.   Knowledge Questionnaire Score: Knowledge Questionnaire Score - 12/09/17 0947      Knowledge Questionnaire Score   Pre Score  22/24       Core Components/Risk Factors/Patient Goals at Admission: Personal Goals and Risk Factors at Admission - 12/09/17 1049      Core Components/Risk Factors/Patient Goals on Admission    Weight Management  Yes;Weight Loss    Intervention  Weight Management: Develop a combined nutrition and exercise program designed to reach desired caloric intake, while maintaining appropriate intake of nutrient and fiber, sodium and fats, and appropriate energy expenditure required for the weight goal.;Weight Management: Provide education and appropriate resources to help participant work on and attain dietary goals.;Weight Management/Obesity: Establish reasonable short term and long term weight goals.    Admit Weight  143 lb 1.3 oz (64.9 kg)    Goal Weight: Short Term  136 lb (61.7 kg)    Goal Weight: Long Term  130 lb (59 kg)    Expected Outcomes  Short Term: Continue to assess and modify interventions until short term weight is achieved;Long Term: Adherence to nutrition and physical activity/exercise program aimed toward attainment of established weight goal;Weight Loss: Understanding of general recommendations for a balanced deficit meal plan, which promotes 1-2 lb weight loss per week and  includes a negative energy balance of [phone] kcal/d;Understanding recommendations for meals to include 15-35% energy as protein, 25-35% energy from fat, 35-60% energy from carbohydrates, less than 200mg  of dietary cholesterol, 20-35 gm of total fiber daily;Understanding of distribution of calorie intake throughout the day with the consumption of 4-5 meals/snacks    Heart Failure  Yes    Intervention  Provide a combined exercise and nutrition program that is supplemented with education, support and counseling about heart failure. Directed toward relieving symptoms such as shortness of breath, decreased exercise tolerance, and extremity edema.    Expected Outcomes  Improve functional capacity of life;Long term: Adoption of self-care skills and reduction of barriers for early signs and symptoms recognition and intervention leading to self-care maintenance.;Short term: Daily weights obtained and reported for increase. Utilizing diuretic protocols set by physician.;Short term: Attendance in program 2-3 days a week with increased exercise capacity. Reported lower sodium intake. Reported increased fruit and vegetable intake. Reports medication compliance.       Core Components/Risk Factors/Patient Goals Review:  Goals and Risk Factor Review    Row Name 12/13/17 1721 01/06/18 1603 02/03/18 1426 03/02/18 1708       Core Components/Risk Factors/Patient Goals Review   Personal Goals Review  Weight Management/Obesity;Heart Failure  Weight Management/Obesity;Heart Failure  Weight Management/Obesity;Heart Failure  Weight Management/Obesity;Heart Failure    Review  pt demonstrates eagerness to lose weight and learn more about improving heart function.  pt eager to increase strength/stamina to be able to resume carrying her 2 yo child.   pt demonstrates eagerness to lose weight and learn more about improving heart function.  pt eager to increase strength/stamina to be able to resume carrying her 2 yo child.    Mahlet is doing great with exercise. Kayleah's vital signs have been stable. Sennie has lost 1.7 kg since starting cardiac rehab.  Seryn is doing great with exercise. Marchia's vital signs have been stable.     Expected Outcomes  pt will participate in CR exercise, nutrition and lifestyle modification education opportunities to decrease overall RF.   pt will participate in CR exercise, nutrition and lifestyle modification education opportunities to decrease overall RF.   pt will participate in CR exercise, nutrition and lifestyle modification education opportunities to decrease overall RF.   pt will participate in CR exercise, nutrition and lifestyle modification education opportunities to decrease overall RF.        Core Components/Risk Factors/Patient Goals at Discharge (Final Review):  Goals and Risk Factor Review - 03/02/18 1708      Core Components/Risk Factors/Patient Goals Review   Personal Goals Review  Weight Management/Obesity;Heart Failure    Review  Deannah is doing great with exercise. Daniyla's vital signs have been stable.     Expected Outcomes  pt will participate in CR exercise, nutrition and lifestyle modification education opportunities to decrease overall RF.        ITP Comments: ITP Comments    Row Name 12/09/17 0819 12/13/17 1720 01/06/18 1602 02/03/18 1423 03/02/18 1705   ITP Comments  Dr. Armanda Magic, Medical Director   pt started group exercise sessions. pt tolerated light activity without difficulty. pt oriented to exercise equipment and safety routine.   30 day ITP Review. Patient with good participation and attendance at phase 2 cardiac rehab.  30 day ITP Review. Patient with good participation and attendance at phase 2 cardiac rehab.  30 day ITP Review. Patient with good participation and attendance at phase 2 cardiac rehab.  Comments: See ITP comments.Gladstone Lighter, RN,BSN 03/02/2018 5:11 PM

## 2018-03-04 ENCOUNTER — Encounter (HOSPITAL_COMMUNITY)
Admission: RE | Admit: 2018-03-04 | Discharge: 2018-03-04 | Disposition: A | Discharge status: Home / Self Care | Payer: BLUE CROSS/BLUE SHIELD | Source: Ambulatory Visit | Attending: Cardiology | Admitting: Cardiology

## 2018-03-04 DIAGNOSIS — I5022 Chronic systolic (congestive) heart failure: Secondary | ICD-10-CM

## 2018-03-04 DIAGNOSIS — I110 Hypertensive heart disease with heart failure: Secondary | ICD-10-CM | POA: Diagnosis not present

## 2018-03-07 ENCOUNTER — Encounter (HOSPITAL_COMMUNITY)
Admission: RE | Admit: 2018-03-07 | Discharge: 2018-03-07 | Disposition: A | Discharge status: Home / Self Care | Payer: BLUE CROSS/BLUE SHIELD | Source: Ambulatory Visit | Attending: Cardiology | Admitting: Cardiology

## 2018-03-07 DIAGNOSIS — I5022 Chronic systolic (congestive) heart failure: Secondary | ICD-10-CM

## 2018-03-07 DIAGNOSIS — I110 Hypertensive heart disease with heart failure: Secondary | ICD-10-CM | POA: Diagnosis not present

## 2018-03-09 ENCOUNTER — Encounter (HOSPITAL_COMMUNITY)
Admission: RE | Admit: 2018-03-09 | Discharge: 2018-03-09 | Disposition: A | Discharge status: Home / Self Care | Payer: BLUE CROSS/BLUE SHIELD | Source: Ambulatory Visit | Attending: Cardiology | Admitting: Cardiology

## 2018-03-09 DIAGNOSIS — I110 Hypertensive heart disease with heart failure: Secondary | ICD-10-CM | POA: Diagnosis not present

## 2018-03-09 DIAGNOSIS — I5022 Chronic systolic (congestive) heart failure: Secondary | ICD-10-CM

## 2018-03-11 ENCOUNTER — Encounter (HOSPITAL_COMMUNITY)
Admission: RE | Admit: 2018-03-11 | Discharge: 2018-03-11 | Disposition: A | Discharge status: Home / Self Care | Payer: BLUE CROSS/BLUE SHIELD | Source: Ambulatory Visit | Attending: Cardiology | Admitting: Cardiology

## 2018-03-11 VITALS — BP 108/68 | HR 85 | Ht 59.0 in | Wt 136.7 lb

## 2018-03-11 DIAGNOSIS — I110 Hypertensive heart disease with heart failure: Secondary | ICD-10-CM | POA: Diagnosis not present

## 2018-03-11 DIAGNOSIS — I5022 Chronic systolic (congestive) heart failure: Secondary | ICD-10-CM

## 2018-03-11 NOTE — Progress Notes (Signed)
Discharge Progress Report  Patient Details  Name: Melinda Allen MRN: [id number] Date of Birth: Aug 11, 1975 Referring Provider:     [place] from 12/09/2017 in [place]  Referring Provider  Fransico Him R MD        Number of Visits: 36  Reason for Discharge:  Patient reached a stable level of exercise. Patient independent in their exercise. Patient has met program and personal goals.  Smoking History:  Social History   Tobacco Use  Smoking Status Former Smoker  . Last attempt to quit: 03/01/2014  . Years since quitting: 4.1  Smokeless Tobacco Never Used    Diagnosis:  Chronic systolic congestive heart failure (HCC)  ADL UCSD:   Initial Exercise Prescription: Initial Exercise Prescription - 12/09/17 1100      Date of Initial Exercise RX and Referring Provider   Date  12/09/17    Referring Provider  Sueanne Margarita MD       Recumbant Bike   Level  2   SciFit   Watts  45    Minutes  10    METs  4.18      NuStep   Level  2    SPM  75    Minutes  10    METs  3.5      Track   Laps  13    Minutes  10    METs  3.3      Prescription Details   Frequency (times per week)  3x    Duration  Progress to 30 minutes of continuous aerobic without signs/symptoms of physical distress      Intensity   THRR 40-80% of Max Heartrate  72-144    Ratings of Perceived Exertion  11-13    Perceived Dyspnea  0-4      Progression   Progression  Continue progressive overload as per policy without signs/symptoms or physical distress.      Resistance Training   Training Prescription  Yes    Weight  3lbs    Reps  10-15       Discharge Exercise Prescription (Final Exercise Prescription Changes): Exercise Prescription Changes - 03/11/18 1456      Response to Exercise   Blood Pressure (Admit)  108/68    Blood Pressure (Exercise)  102/56    Blood Pressure (Exit)  104/64    Heart Rate (Admit)  85 bpm    Heart Rate  (Exercise)  139 bpm    Heart Rate (Exit)  81 bpm    Rating of Perceived Exertion (Exercise)  12    Symptoms  none    Duration  Progress to 30 minutes of  aerobic without signs/symptoms of physical distress    Intensity  THRR unchanged      Progression   Progression  Continue to progress workloads to maintain intensity without signs/symptoms of physical distress.    Average METs  4.7      Resistance Training   Training Prescription  Yes    Weight  4lbs    Reps  10-15    Time  10 Minutes      Interval Training   Interval Training  No      NuStep   Level  6    SPM  85    Minutes  10    METs  4.6      Rower   Level  4    Watts  42  Minutes  10    METs  5.85      Track   Laps  15    Minutes  3.61    METs  3.78      Home Exercise Plan   Plans to continue exercise at  Home (comment)    Frequency  Add 2 additional days to program exercise sessions.    Initial Home Exercises Provided  12/20/17       Functional Capacity: 6 Minute Walk    Row Name 12/09/17 1059 02/23/18 1508       6 Minute Walk   Phase  Initial  Discharge    Distance  1552 feet  2018 feet    Distance % Change  -  30.03 %    Walk Time  6 minutes  6 minutes    # of Rest Breaks  0  0    MPH  2.94  3.82    METS  4.63  5.82    RPE  9  11    Perceived Dyspnea   0  -    VO2 Peak  16.19  20.35    Symptoms  No  No    Resting HR  82 bpm  90 bpm    Resting BP  102/40  98/62    Resting Oxygen Saturation   99 %  -    Exercise Oxygen Saturation  during 6 min walk  100 %  -    Max Ex. HR  110 bpm  135 bpm    Max Ex. BP  104/72  118/72    2 Minute Post BP  94/64  100/54       Psychological, QOL, Others - Outcomes: PHQ 2/9: Depression screen [facility] 2/9 03/11/2018 12/13/2017  Decreased Interest 0 0  Down, Depressed, Hopeless 0 0  PHQ - 2 Score 0 0    Quality of Life: Quality of Life - 03/11/18 0711      Quality of Life   Select  Quality of Life      Quality of Life Scores   Health/Function Pre   28 %    Health/Function Post  30 %    Health/Function % Change  7.14 %    Socioeconomic Pre  30 %    Socioeconomic Post  30 %    Socioeconomic % Change   0 %    Psych/Spiritual Pre  30 %    Psych/Spiritual Post  30 %    Psych/Spiritual % Change  0 %    Family Pre  30 %    Family Post  30 %    Family % Change  0 %    GLOBAL Pre  29.14 %    GLOBAL Post  30 %    GLOBAL % Change  2.95 %       Personal Goals: Goals established at orientation with interventions provided to work toward goal. Personal Goals and Risk Factors at Admission - 12/09/17 1049      Core Components/Risk Factors/Patient Goals on Admission    Weight Management  Yes;Weight Loss    Intervention  Weight Management: Develop a combined nutrition and exercise program designed to reach desired caloric intake, while maintaining appropriate intake of nutrient and fiber, sodium and fats, and appropriate energy expenditure required for the weight goal.;Weight Management: Provide education and appropriate resources to help participant work on and attain dietary goals.;Weight Management/Obesity: Establish reasonable short term and long term weight goals.    Admit Weight  143 lb 1.3 oz (64.9 kg)    Goal Weight: Short Term  136 lb (61.7 kg)    Goal Weight: Long Term  130 lb (59 kg)    Expected Outcomes  Short Term: Continue to assess and modify interventions until short term weight is achieved;Long Term: Adherence to nutrition and physical activity/exercise program aimed toward attainment of established weight goal;Weight Loss: Understanding of general recommendations for a balanced deficit meal plan, which promotes 1-2 lb weight loss per week and includes a negative energy balance of [phone] kcal/d;Understanding recommendations for meals to include 15-35% energy as protein, 25-35% energy from fat, 35-60% energy from carbohydrates, less than 258m of dietary cholesterol, 20-35 gm of total fiber daily;Understanding of distribution of  calorie intake throughout the day with the consumption of 4-5 meals/snacks    Heart Failure  Yes    Intervention  Provide a combined exercise and nutrition program that is supplemented with education, support and counseling about heart failure. Directed toward relieving symptoms such as shortness of breath, decreased exercise tolerance, and extremity edema.    Expected Outcomes  Improve functional capacity of life;Long term: Adoption of self-care skills and reduction of barriers for early signs and symptoms recognition and intervention leading to self-care maintenance.;Short term: Daily weights obtained and reported for increase. Utilizing diuretic protocols set by physician.;Short term: Attendance in program 2-3 days a week with increased exercise capacity. Reported lower sodium intake. Reported increased fruit and vegetable intake. Reports medication compliance.        Personal Goals Discharge: Goals and Risk Factor Review    Row Name 12/13/17 1721 01/06/18 1603 02/03/18 1426 03/02/18 1708 04/06/18 1201     Core Components/Risk Factors/Patient Goals Review   Personal Goals Review  Weight Management/Obesity;Heart Failure  Weight Management/Obesity;Heart Failure  Weight Management/Obesity;Heart Failure  Weight Management/Obesity;Heart Failure  Weight Management/Obesity;Heart Failure   Review  pt demonstrates eagerness to lose weight and learn more about improving heart function.  pt eager to increase strength/stamina to be able to resume carrying her 2 yo child.   pt demonstrates eagerness to lose weight and learn more about improving heart function.  pt eager to increase strength/stamina to be able to resume carrying her 2 yo child.   CSulmais doing great with exercise. Arushi's vital signs have been stable. CKelyhas lost 1.7 kg since starting cardiac rehab.  CJenavieveis doing great with exercise. Kaelyn's vital signs have been stable.   CJerlineis did great with exercise. Siri's  vital signs have been stable.    Expected Outcomes  pt will participate in CR exercise, nutrition and lifestyle modification education opportunities to decrease overall RF.   pt will participate in CR exercise, nutrition and lifestyle modification education opportunities to decrease overall RF.   pt will participate in CR exercise, nutrition and lifestyle modification education opportunities to decrease overall RF.   pt will participate in CR exercise, nutrition and lifestyle modification education opportunities to decrease overall RF.   Jacqualine plans to conitnue exercise on her own and use handweights at home and follow nutrition and lifestyle modfications.      Exercise Goals and Review: Exercise Goals    Row Name 12/09/17 1100             Exercise Goals   Increase Physical Activity  Yes       Intervention  Provide advice, education, support and counseling about physical activity/exercise needs.;Develop an individualized exercise prescription for aerobic and resistive training based on initial evaluation findings,  risk stratification, comorbidities and participant's personal goals.       Expected Outcomes  Short Term: Attend rehab on a regular basis to increase amount of physical activity.;Long Term: Add in home exercise to make exercise part of routine and to increase amount of physical activity.;Long Term: Exercising regularly at least 3-5 days a week.       Increase Strength and Stamina  Yes       Intervention  Provide advice, education, support and counseling about physical activity/exercise needs.;Develop an individualized exercise prescription for aerobic and resistive training based on initial evaluation findings, risk stratification, comorbidities and participant's personal goals.       Expected Outcomes  Short Term: Increase workloads from initial exercise prescription for resistance, speed, and METs.;Short Term: Perform resistance training exercises routinely during rehab and add in  resistance training at home;Long Term: Improve cardiorespiratory fitness, muscular endurance and strength as measured by increased METs and functional capacity (6MWT)       Able to understand and use rate of perceived exertion (RPE) scale  Yes       Intervention  Provide education and explanation on how to use RPE scale       Expected Outcomes  Short Term: Able to use RPE daily in rehab to express subjective intensity level;Long Term:  Able to use RPE to guide intensity level when exercising independently       Knowledge and understanding of Target Heart Rate Range (THRR)  Yes       Intervention  Provide education and explanation of THRR including how the numbers were predicted and where they are located for reference       Expected Outcomes  Short Term: Able to state/look up THRR;Long Term: Able to use THRR to govern intensity when exercising independently;Short Term: Able to use daily as guideline for intensity in rehab       Able to check pulse independently  Yes       Intervention  Provide education and demonstration on how to check pulse in carotid and radial arteries.;Review the importance of being able to check your own pulse for safety during independent exercise       Expected Outcomes  Short Term: Able to explain why pulse checking is important during independent exercise;Long Term: Able to check pulse independently and accurately       Understanding of Exercise Prescription  Yes       Intervention  Provide education, explanation, and written materials on patient's individual exercise prescription       Expected Outcomes  Short Term: Able to explain program exercise prescription;Long Term: Able to explain home exercise prescription to exercise independently          Nutrition & Weight - Outcomes: Pre Biometrics - 12/09/17 1101      Pre Biometrics   Height  4' 11"  (1.499 m)    Weight  64.9 kg    Waist Circumference  36.5 inches    Hip Circumference  39 inches    Waist to Hip Ratio   0.94 %    BMI (Calculated)  28.88    Triceps Skinfold  28 mm    % Body Fat  39 %    Grip Strength  26 kg    Flexibility  13 in    Single Leg Stand  30 seconds      Post Biometrics - 03/11/18 1456       Post  Biometrics   Height  4' 11"  (1.499 m)  Weight  62 kg    Waist Circumference  32 inches    Hip Circumference  36 inches    Waist to Hip Ratio  0.89 %    BMI (Calculated)  27.59    Triceps Skinfold  34 mm    % Body Fat  37.7 %    Grip Strength  27 kg    Flexibility  13 in    Single Leg Stand  26.57 seconds       Nutrition: Nutrition Therapy & Goals - 12/09/17 1152      Nutrition Therapy   Diet  Heart Healthy      Personal Nutrition Goals   Nutrition Goal  Pt to identify food quantities necessary to achieve weight loss of 5-8 lb at graduation from cardiac rehab. Goal wt of 135 lb desired.       Intervention Plan   Intervention  Prescribe, educate and counsel regarding individualized specific dietary modifications aiming towards targeted core components such as weight, hypertension, lipid management, diabetes, heart failure and other comorbidities.    Expected Outcomes  Short Term Goal: Understand basic principles of dietary content, such as calories, fat, sodium, cholesterol and nutrients.;Long Term Goal: Adherence to prescribed nutrition plan.       Nutrition Discharge: Nutrition Assessments - 03/11/18 0816      MEDFICTS Scores   Pre Score  30    Post Score  6    Score Difference  -24       Education Questionnaire Score: Knowledge Questionnaire Score - 03/11/18 0711      Knowledge Questionnaire Score   Pre Score  22/24    Post Score  24/24       Goals reviewed with patient; copy given to patient. Wiletta graduated from cardiac rehab program on 03/11/18 with completion of 36 exercise sessions in Phase II. Pt maintained good attendance and progressed nicely during his participation in rehab as evidenced by increased MET level.   Medication list  reconciled. Repeat  PHQ score- 0 .  Pt has made significant lifestyle changes and should be commended for her success. Pt feels she has achieved her goals during cardiac rehab.   Pt plans to continue exercise by walking and using hand weights at home. Cate increased her distance on her post exercise walk test and lost 2.9 kg. We are proud of Caasi's progress.!Barnet Pall, RN,BSN 04/06/2018 12:14 PM

## 2018-03-14 ENCOUNTER — Encounter (HOSPITAL_COMMUNITY): Payer: BLUE CROSS/BLUE SHIELD

## 2018-03-16 ENCOUNTER — Encounter (HOSPITAL_COMMUNITY): Payer: BLUE CROSS/BLUE SHIELD

## 2018-04-06 NOTE — Addendum Note (Signed)
Encounter addended by: Cammy Copa, RN on: 04/06/2018 12:20 PM  Actions taken: Flowsheet data copied forward, Visit Navigator Flowsheet section accepted, Sign clinical note

## 2018-04-08 ENCOUNTER — Other Ambulatory Visit: Payer: Self-pay | Admitting: Obstetrics and Gynecology

## 2018-04-08 DIAGNOSIS — R928 Other abnormal and inconclusive findings on diagnostic imaging of breast: Secondary | ICD-10-CM

## 2018-04-13 ENCOUNTER — Ambulatory Visit: Payer: BLUE CROSS/BLUE SHIELD

## 2018-04-13 ENCOUNTER — Ambulatory Visit
Admission: RE | Admit: 2018-04-13 | Discharge: 2018-04-13 | Disposition: A | Discharge status: Home / Self Care | Payer: BLUE CROSS/BLUE SHIELD | Source: Ambulatory Visit | Attending: Obstetrics and Gynecology | Admitting: Obstetrics and Gynecology

## 2018-04-13 DIAGNOSIS — R928 Other abnormal and inconclusive findings on diagnostic imaging of breast: Secondary | ICD-10-CM

## 2019-04-05 IMAGING — MG DIGITAL DIAGNOSTIC UNILATERAL LEFT MAMMOGRAM WITH TOMO AND CAD
4 series · 4 of 12 positions shown · non-contrast
Comparison: Screening study, 04/01/2018, which was the baseline
exam.

CLINICAL DATA: Screening recall for a possible asymmetry in the
left breast.

EXAM:
DIGITAL DIAGNOSTIC UNILATERAL LEFT MAMMOGRAM WITH CAD AND TOMO

[L CC synth-2D]
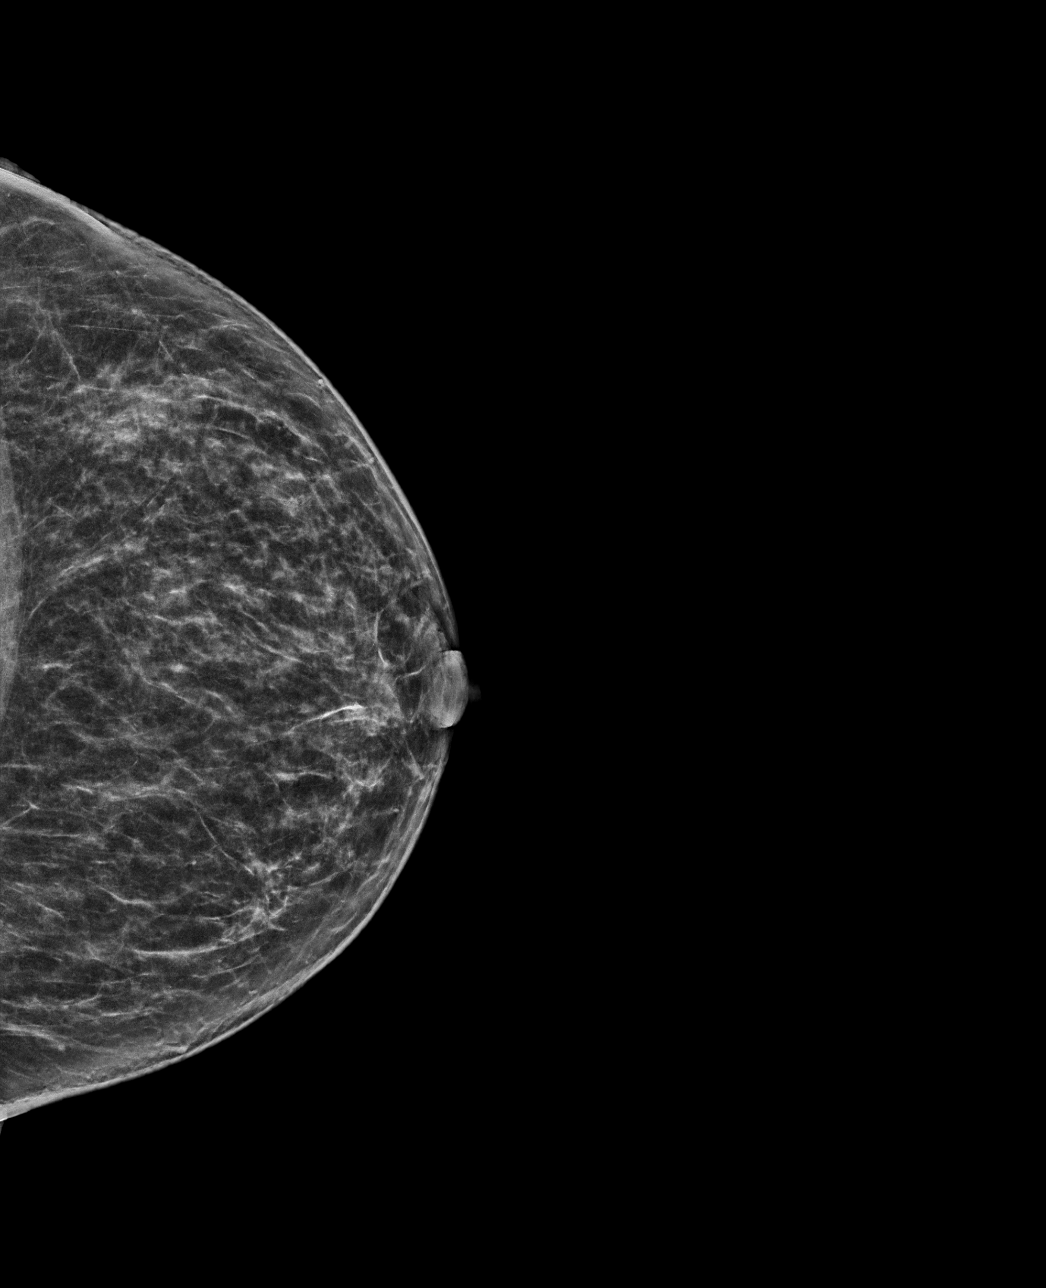

[L MLO synth-2D]
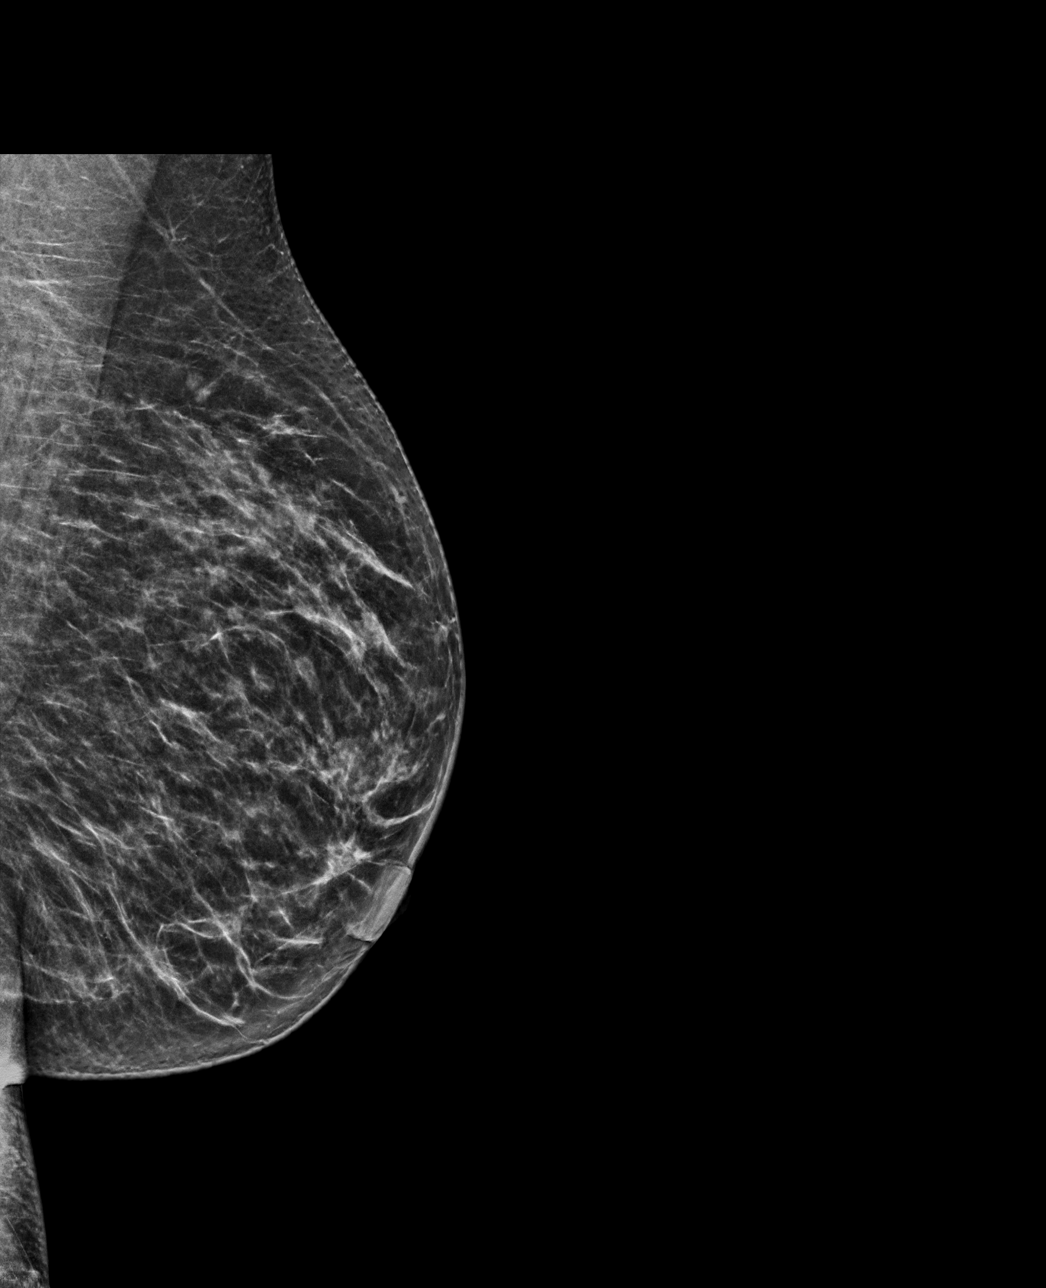

[L CC tomo · tomo slice 31/61.0]
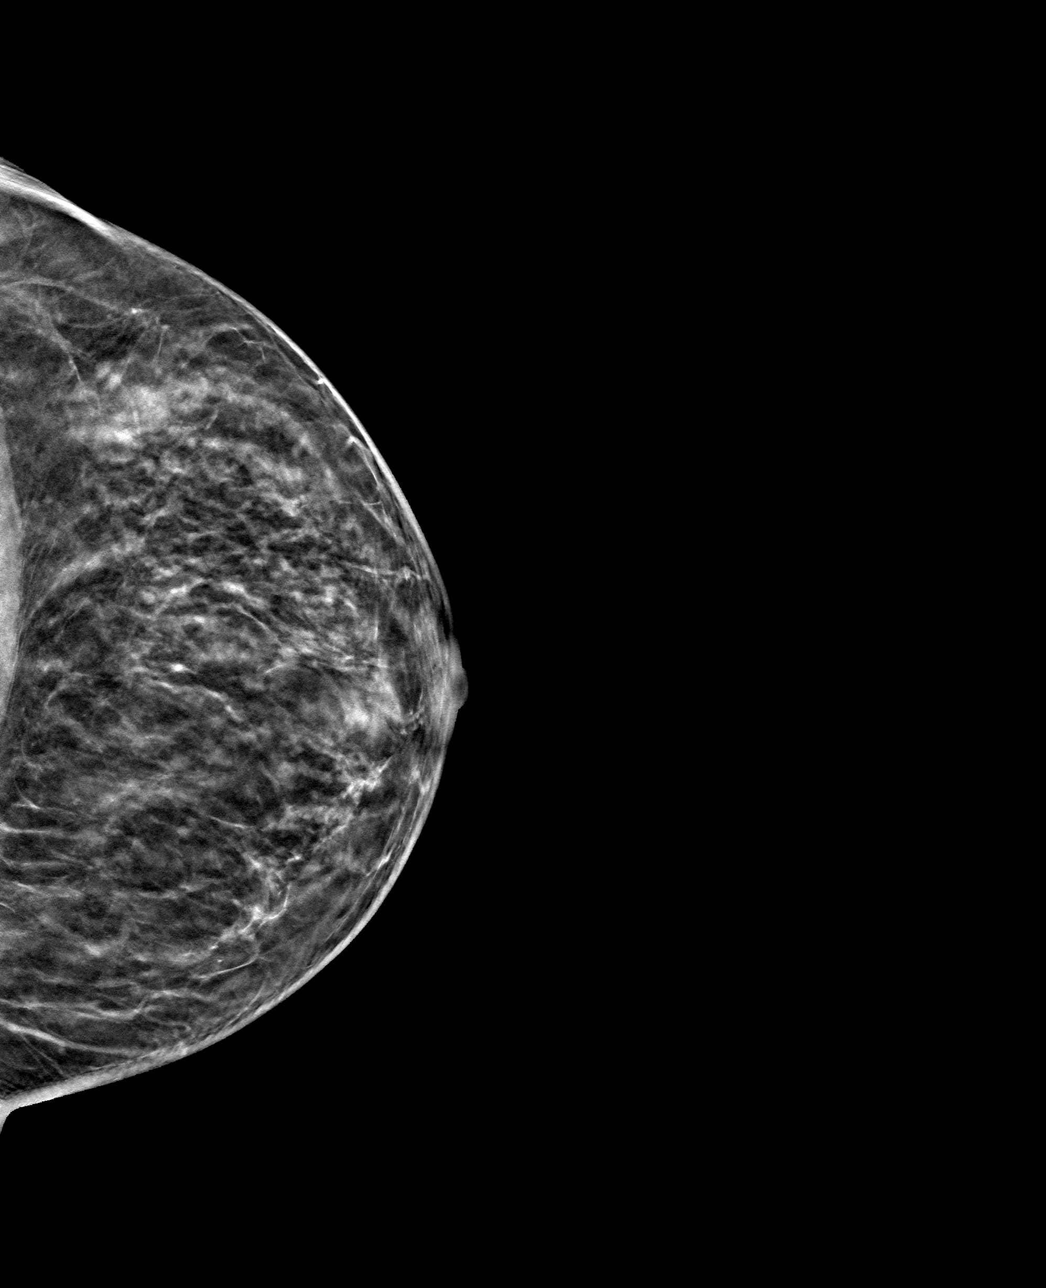

[L MLO tomo · tomo slice 30/59.0]
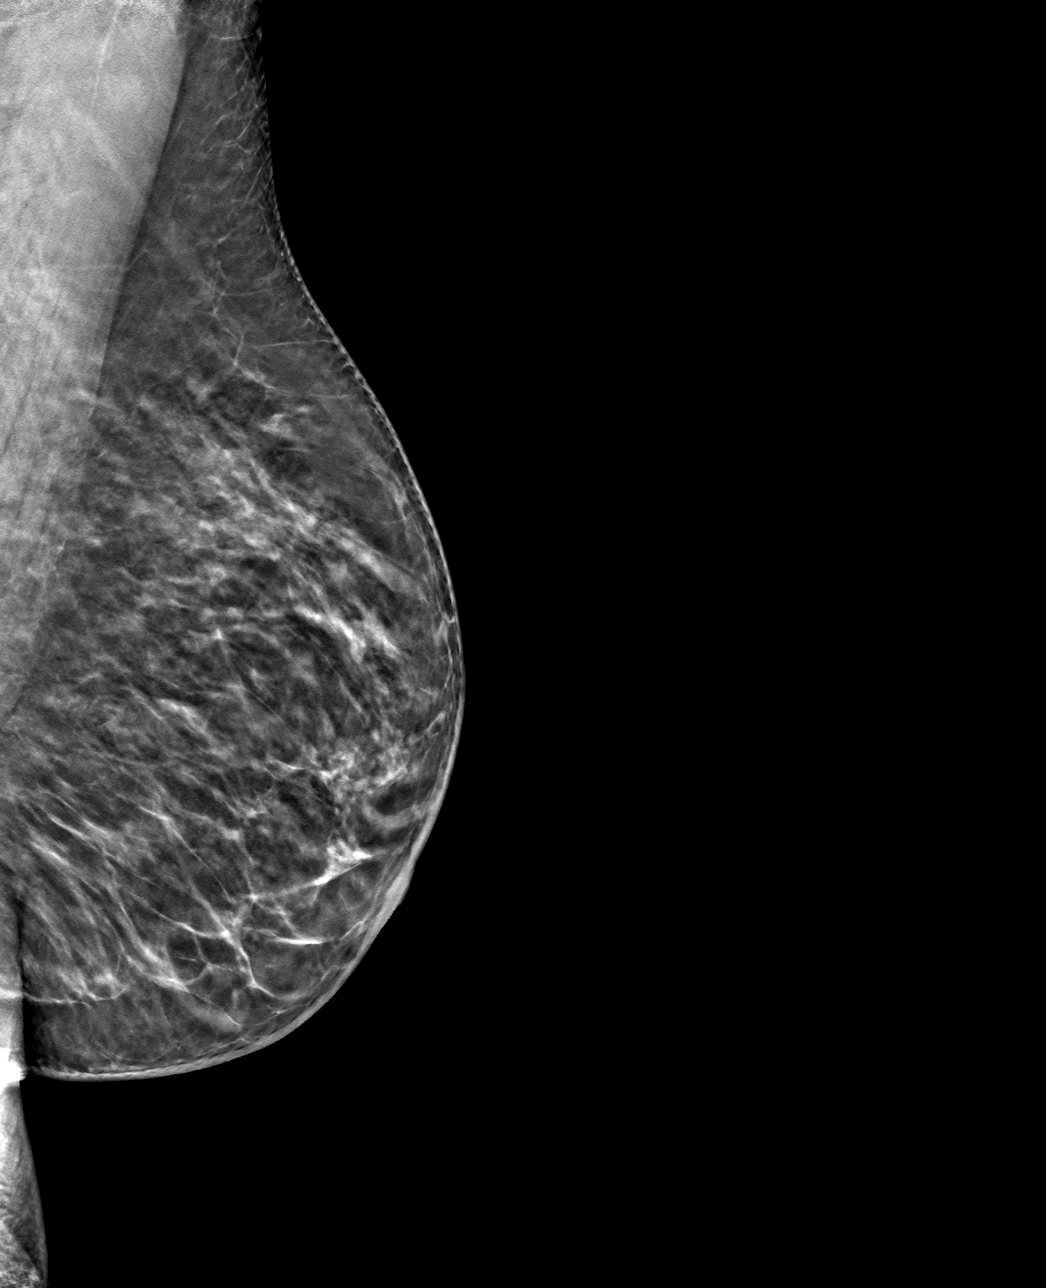

[4 of 12 positions shown; findings below may reference images not displayed]

ACR Breast Density Category b: There are scattered areas of
fibroglandular density.
FINDINGS: The area of possible asymmetry is not reproduced on the diagnostic
3D images, consistent with it being due to superimposed
fibroglandular tissue on the screening 2D study.

There are no discrete masses or areas of significant asymmetry.
There is no architectural distortion and there are no suspicious
calcifications.

Mammographic images were processed with CAD.
IMPRESSION: Negative exam.  No evidence of breast malignancy.

RECOMMENDATION:
Screening mammogram in one year.(Code:[id number]-[id number])

I have discussed the findings and recommendations with the patient.
Results were also provided in writing at the conclusion of the
visit. If applicable, a reminder letter will be sent to the patient
regarding the next appointment.

BI-RADS CATEGORY  1: Negative.

## 2023-07-28 ENCOUNTER — Ambulatory Visit (AMBULATORY_SURGERY_CENTER): Payer: 59

## 2023-07-28 VITALS — Ht 59.0 in | Wt 155.0 lb

## 2023-07-28 DIAGNOSIS — Z1211 Encounter for screening for malignant neoplasm of colon: Secondary | ICD-10-CM

## 2023-07-28 MED ORDER — SUFLAVE 178.7 G PO SOLR: 1.0000 | ORAL | 0 refills | Status: DC

## 2023-07-28 NOTE — Progress Notes (Signed)

## 2023-08-04 ENCOUNTER — Encounter: Payer: Self-pay | Admitting: Pediatrics

## 2023-08-12 ENCOUNTER — Encounter: Payer: BLUE CROSS/BLUE SHIELD | Admitting: Pediatrics

## 2023-08-19 NOTE — Progress Notes (Unsigned)
[facility] History and Physical   Primary Care Physician:  Mila Palmer, MD   Reason for Procedure:  Colon cancer screening  Plan:    Screening colonoscopy  HPI: Melinda Allen is a 48 y.o. female undergoing screening colonoscopy for average risk colon cancer screening.  No previous history of colonoscopy.  No family history of colon polyps or colon cancer.  No change in bowel habits or rectal bleeding.   Past Medical History:  Diagnosis Date   Hypertension    Thalassemia, unspecified     Past Surgical History:  Procedure Laterality Date   CESAREAN SECTION N/A 08/17/2015   Procedure: CESAREAN SECTION;  Surgeon: Philip Aspen, DO;  Location: [facility];  Service: Obstetrics;  Laterality: N/A;    Prior to Admission medications   Medication Sig Start Date End Date Taking? Authorizing Provider  cholecalciferol (VITAMIN D3) 25 MCG (1000 UNIT) tablet Take 1,000 Units by mouth daily.    [provider]  ferrous sulfate 325 (65 FE) MG tablet Take 325 mg by mouth at bedtime.     [provider]  JARDIANCE 10 MG TABS tablet Take 10 mg by mouth daily. 06/16/23   [provider]  loratadine (CLARITIN) 10 MG tablet Take 10 mg by mouth daily as needed for allergies.    [provider]  losartan (COZAAR) 50 MG tablet Take 75 mg by mouth daily. 06/07/23   [provider]  Melatonin 1 MG CAPS Take 1 capsule by mouth at bedtime as needed.    [provider]  metoprolol succinate (TOPROL-XL) 25 MG 24 hr tablet Take 50 mg by mouth daily.    [provider]  Multiple Vitamin (MULTIVITAMIN ADULT) TABS 1 tablet Orally once a day    [provider]  PEG 3350-KCl-NaCl-NaSulf-MgSul (SUFLAVE) 178.7 g SOLR Take 1 kit by mouth as directed. 07/28/23   Ottie Glazier, MD  spironolactone (ALDACTONE) 25 MG tablet Take 25 mg by mouth daily.    [provider]    Current Outpatient Medications  Medication Sig  Dispense Refill   cholecalciferol (VITAMIN D3) 25 MCG (1000 UNIT) tablet Take 1,000 Units by mouth daily.     ferrous sulfate 325 (65 FE) MG tablet Take 325 mg by mouth at bedtime.      JARDIANCE 10 MG TABS tablet Take 10 mg by mouth daily.     loratadine (CLARITIN) 10 MG tablet Take 10 mg by mouth daily as needed for allergies.     losartan (COZAAR) 50 MG tablet Take 75 mg by mouth daily.     Melatonin 1 MG CAPS Take 1 capsule by mouth at bedtime as needed.     metoprolol succinate (TOPROL-XL) 25 MG 24 hr tablet Take 50 mg by mouth daily.     Multiple Vitamin (MULTIVITAMIN ADULT) TABS 1 tablet Orally once a day     PEG 3350-KCl-NaCl-NaSulf-MgSul (SUFLAVE) 178.7 g SOLR Take 1 kit by mouth as directed. 1 each 0   spironolactone (ALDACTONE) 25 MG tablet Take 25 mg by mouth daily.     No current facility-administered medications for this visit.    Allergies as of 08/20/2023 - Review Complete 07/28/2023  Allergen Reaction Noted   Hydroxyzine hcl Other (See Comments) 11/05/2017   Lactose Other (See Comments) 11/03/2017    Family History  Problem Relation Age of Onset   Colon polyps Father    Colon cancer Neg Hx    Rectal cancer Neg Hx    Stomach cancer Neg  Hx     Social History   Socioeconomic History   Marital status: Married    Spouse name: Not on file   Number of children: Not on file   Years of education: Not on file   Highest education level: Not on file  Occupational History   Not on file  Tobacco Use   Smoking status: Former    Current packs/day: 0.00    Types: Cigarettes    Quit date: 03/01/2014    Years since quitting: 9.4   Smokeless tobacco: Never  Substance and Sexual Activity   Alcohol use: No   Drug use: No   Sexual activity: Not Currently    Birth control/protection: None  Other Topics Concern   Not on file  Social History Narrative   Not on file   Social Drivers of Health   Financial Resource Strain: Not on file  Food Insecurity: Not on file   Transportation Needs: Not on file  Physical Activity: Not on file  Stress: Not on file  Social Connections: Not on file  Intimate Partner Violence: Not on file    Review of Systems:  All other review of systems negative except as mentioned in the HPI.  Physical Exam: Vital signs There were no vitals taken for this visit.  General:   Alert,  Well-developed, well-nourished, pleasant and cooperative in NAD Airway:  Mallampati  Lungs:  Clear throughout to auscultation.   Heart:  Regular rate and rhythm; no murmurs, clicks, rubs,  or gallops. Abdomen:  Soft, nontender and nondistended. Normal bowel sounds.   Neuro/Psych:  Normal mood and affect. A and O x 3  Maren Beach, MD [facility]

## 2023-08-20 ENCOUNTER — Ambulatory Visit (AMBULATORY_SURGERY_CENTER): Payer: 59 | Admitting: Pediatrics

## 2023-08-20 ENCOUNTER — Encounter: Payer: Self-pay | Admitting: Pediatrics

## 2023-08-20 VITALS — BP 102/61 | HR 80 | Temp 98.1°F | Resp 11 | Ht 59.0 in | Wt 155.0 lb

## 2023-08-20 DIAGNOSIS — K648 Other hemorrhoids: Secondary | ICD-10-CM | POA: Diagnosis not present

## 2023-08-20 DIAGNOSIS — Z1211 Encounter for screening for malignant neoplasm of colon: Secondary | ICD-10-CM

## 2023-08-20 DIAGNOSIS — Z83719 Family history of colon polyps, unspecified: Secondary | ICD-10-CM | POA: Diagnosis not present

## 2023-08-20 DIAGNOSIS — K644 Residual hemorrhoidal skin tags: Secondary | ICD-10-CM | POA: Diagnosis not present

## 2023-08-20 MED ORDER — SODIUM CHLORIDE 0.9 % IV SOLN: 500.0000 mL | Freq: Once | INTRAVENOUS | Status: DC

## 2023-08-20 NOTE — Op Note (Signed)
[facility] Patient Name: Melinda Allen Procedure Date: 08/20/2023 1:55 PM MRN: [id number] Endoscopist: Maren Beach , MD, [id number] Age: 48 Referring MD:  Date of Birth: [birth year] Gender: Female Account #: [id number] Procedure:                Colonoscopy Indications:              Colon cancer screening in patient at increased                            risk: Family history of 1st-degree relative with                            colon polyps, This is the patient's first                            colonoscopy Medicines:                Monitored Anesthesia Care Procedure:                Pre-Anesthesia Assessment:                           - Prior to the procedure, a History and Physical                            was performed, and patient medications and                            allergies were reviewed. The patient's tolerance of                            previous anesthesia was also reviewed. The risks                            and benefits of the procedure and the sedation                            options and risks were discussed with the patient.                            All questions were answered, and informed consent                            was obtained. Prior Anticoagulants: The patient has                            taken no anticoagulant or antiplatelet agents. ASA                            Grade Assessment: II - A patient with mild systemic                            disease. After reviewing the risks and benefits,  the patient was deemed in satisfactory condition to                            undergo the procedure.                           After obtaining informed consent, the colonoscope                            was passed under direct vision. Throughout the                            procedure, the patient's blood pressure, pulse, and                            oxygen saturations were monitored continuously. The                             Olympus Scope SN [id number] was introduced through the                            anus and advanced to the terminal ileum. The                            colonoscopy was performed without difficulty. The                            patient tolerated the procedure well. The quality                            of the bowel preparation was adequate. The terminal                            ileum, ileocecal valve, appendiceal orifice, and                            rectum were photographed. Scope In: 2:07:59 PM Scope Out: 2:23:53 PM Scope Withdrawal Time: 0 hours 11 minutes 9 seconds  Total Procedure Duration: 0 hours 15 minutes 54 seconds  Findings:                 Skin tags were found on perianal exam.                           The digital rectal exam was normal. Pertinent                            negatives include normal sphincter tone and no                            palpable rectal lesions.                           The colon (entire examined portion) appeared normal.  The terminal ileum appeared normal.                           Internal hemorrhoids were found during retroflexion. Complications:            No immediate complications. Estimated blood loss:                            None. Estimated Blood Loss:     Estimated blood loss: none. Impression:               - Perianal skin tags found on perianal exam.                           - The entire examined colon is normal.                           - The examined portion of the ileum was normal.                           - Internal hemorrhoids.                           - No specimens collected. Recommendation:           - Discharge patient to home (ambulatory).                           - Repeat colonoscopy in 10 years for screening                            purposes.                           - The findings and recommendations were discussed                            with the patient's  family.                           - Return to referring physician.                           - Patient has a contact number available for                            emergencies. The signs and symptoms of potential                            delayed complications were discussed with the                            patient. Return to normal activities tomorrow.                            Written discharge instructions were provided to the  patient. Maren Beach, MD 08/20/2023 2:26:46 PM This report has been signed electronically.

## 2023-08-20 NOTE — Patient Instructions (Signed)
Thank you for letting us care for your healthcare needs today! Please see handout regarding Hemorrhoids.  YOU HAD AN ENDOSCOPIC PROCEDURE TODAY AT THE [facility]:   Refer to the procedure report that was given to you for any specific questions about what was found during the examination.  If the procedure report does not answer your questions, please call your gastroenterologist to clarify.  If you requested that your care partner not be given the details of your procedure findings, then the procedure report has been included in a sealed envelope for you to review at your convenience later.  YOU SHOULD EXPECT: Some feelings of bloating in the abdomen. Passage of more gas than usual.  Walking can help get rid of the air that was put into your GI tract during the procedure and reduce the bloating. If you had a lower endoscopy (such as a colonoscopy or flexible sigmoidoscopy) you may notice spotting of blood in your stool or on the toilet paper. If you underwent a bowel prep for your procedure, you may not have a normal bowel movement for a few days.  Please Note:  You might notice some irritation and congestion in your nose or some drainage.  This is from the oxygen used during your procedure.  There is no need for concern and it should clear up in a day or so.  SYMPTOMS TO REPORT IMMEDIATELY:  Following lower endoscopy (colonoscopy or flexible sigmoidoscopy):  Excessive amounts of blood in the stool  Significant tenderness or worsening of abdominal pains  Swelling of the abdomen that is new, acute  Fever of 100F or higher  For urgent or emergent issues, a gastroenterologist can be reached at any hour by calling [phone]. Do not use MyChart messaging for urgent concerns.    DIET:  We do recommend a small meal at first, but then you may proceed to your regular diet.  Drink plenty of fluids but you should avoid alcoholic beverages for 24 hours.  ACTIVITY:  You should plan to  take it easy for the rest of today and you should NOT DRIVE or use heavy machinery until tomorrow (because of the sedation medicines used during the test).    FOLLOW UP: Our staff will call the number listed on your records the next business day following your procedure.  We will call around 7:15- 8:00 am to check on you and address any questions or concerns that you may have regarding the information given to you following your procedure. If we do not reach you, we will leave a message.     If any biopsies were taken you will be contacted by phone or by letter within the next 1-3 weeks.  Please call us at [phone] if you have not heard about the biopsies in 3 weeks.    SIGNATURES/CONFIDENTIALITY: You and/or your care partner have signed paperwork which will be entered into your electronic medical record.  These signatures attest to the fact that that the information above on your After Visit Summary has been reviewed and is understood.  Full responsibility of the confidentiality of this discharge information lies with you and/or your care-partner.

## 2023-08-23 ENCOUNTER — Telehealth: Payer: Self-pay

## 2023-08-23 NOTE — Telephone Encounter (Signed)
 No answer, left message to call if having any issues or concerns, B.Vale Mousseau RN
